# Patient Record
Sex: Male | Born: 1990 | Race: Black or African American | Hispanic: No | Marital: Single | Smoking: Never smoker
Health system: Southern US, Community
[De-identification: ages and names within clinical notes are randomized; demographics above are authoritative.]

## PROBLEM LIST (undated history)

## (undated) DIAGNOSIS — I509 Heart failure, unspecified: Secondary | ICD-10-CM

---

## 2019-09-28 ENCOUNTER — Encounter (HOSPITAL_COMMUNITY): Payer: Self-pay | Admitting: Internal Medicine

## 2019-09-28 ENCOUNTER — Inpatient Hospital Stay (HOSPITAL_COMMUNITY)
Admission: AD | Admit: 2019-09-28 | Discharge: 2019-10-03 | DRG: 293 | Disposition: A | Discharge status: Home / Self Care | Payer: 59 | Source: Ambulatory Visit | Attending: Internal Medicine | Admitting: Internal Medicine

## 2019-09-28 ENCOUNTER — Ambulatory Visit (HOSPITAL_COMMUNITY)
Admission: RE | Admit: 2019-09-28 | Discharge: 2019-09-28 | Disposition: A | Discharge status: Home / Self Care | Payer: Self-pay | Source: Ambulatory Visit | Attending: Internal Medicine | Admitting: Internal Medicine

## 2019-09-28 ENCOUNTER — Other Ambulatory Visit: Payer: Self-pay

## 2019-09-28 VITALS — BP 126/76 | HR 114 | Wt 283.0 lb

## 2019-09-28 DIAGNOSIS — Z87891 Personal history of nicotine dependence: Secondary | ICD-10-CM

## 2019-09-28 DIAGNOSIS — I110 Hypertensive heart disease with heart failure: Secondary | ICD-10-CM | POA: Insufficient documentation

## 2019-09-28 DIAGNOSIS — I5023 Acute on chronic systolic (congestive) heart failure: Secondary | ICD-10-CM | POA: Insufficient documentation

## 2019-09-28 DIAGNOSIS — I5082 Biventricular heart failure: Secondary | ICD-10-CM | POA: Insufficient documentation

## 2019-09-28 DIAGNOSIS — R140 Abdominal distension (gaseous): Secondary | ICD-10-CM | POA: Insufficient documentation

## 2019-09-28 DIAGNOSIS — Z8249 Family history of ischemic heart disease and other diseases of the circulatory system: Secondary | ICD-10-CM

## 2019-09-28 DIAGNOSIS — Z79899 Other long term (current) drug therapy: Secondary | ICD-10-CM | POA: Insufficient documentation

## 2019-09-28 DIAGNOSIS — I428 Other cardiomyopathies: Secondary | ICD-10-CM | POA: Diagnosis present

## 2019-09-28 DIAGNOSIS — E669 Obesity, unspecified: Secondary | ICD-10-CM | POA: Diagnosis present

## 2019-09-28 DIAGNOSIS — Z9114 Patient's other noncompliance with medication regimen: Secondary | ICD-10-CM | POA: Diagnosis not present

## 2019-09-28 LAB — CBC
HCT: 44.7 % (ref 39.0–52.0)
HCT: 45.4 % (ref 39.0–52.0)
Hemoglobin: 14.5 g/dL (ref 13.0–17.0)
Hemoglobin: 15 g/dL (ref 13.0–17.0)
MCH: 27.4 pg (ref 26.0–34.0)
MCH: 27.5 pg (ref 26.0–34.0)
MCHC: 32.4 g/dL (ref 30.0–36.0)
MCHC: 33 g/dL (ref 30.0–36.0)
MCV: 84.3 fL (ref 80.0–100.0)
MCV: 83.3 fL (ref 80.0–100.0)
Platelets: 258 10*3/uL (ref 150–400)
Platelets: 286 10*3/uL (ref 150–400)
RBC: 5.3 MIL/uL (ref 4.22–5.81)
RBC: 5.45 MIL/uL (ref 4.22–5.81)
RDW: 17 % — ABNORMAL HIGH (ref 11.5–15.5)
RDW: 17 % — ABNORMAL HIGH (ref 11.5–15.5)
WBC: 8.9 10*3/uL (ref 4.0–10.5)
WBC: 9.3 10*3/uL (ref 4.0–10.5)
nRBC: 0 % (ref 0.0–0.2)
nRBC: 0 % (ref 0.0–0.2)

## 2019-09-28 LAB — CREATININE, SERUM
Creatinine, Ser: 1.11 mg/dL (ref 0.61–1.24)
GFR calc Af Amer: >60 mL/min (ref >60)
GFR calc non Af Amer: >60 mL/min (ref >60)

## 2019-09-28 LAB — COMPREHENSIVE METABOLIC PANEL
ALT: 34 U/L (ref 0–44)
AST: 39 U/L (ref 15–41)
Albumin: 3.3 g/dL — ABNORMAL LOW (ref 3.5–5.0)
Alkaline Phosphatase: 73 U/L (ref 38–126)
Anion gap: 12 (ref 5–15)
BUN: 8 mg/dL (ref 6–20)
CO2: 21 mmol/L — ABNORMAL LOW (ref 22–32)
Calcium: 8.8 mg/dL — ABNORMAL LOW (ref 8.9–10.3)
Chloride: 99 mmol/L (ref 98–111)
Creatinine, Ser: 1.08 mg/dL (ref 0.61–1.24)
GFR calc Af Amer: >60 mL/min (ref >60)
GFR calc non Af Amer: >60 mL/min (ref >60)
Glucose, Bld: 118 mg/dL — ABNORMAL HIGH (ref 70–99)
Potassium: 3.7 mmol/L (ref 3.5–5.1)
Sodium: 132 mmol/L — ABNORMAL LOW (ref 135–145)
Total Bilirubin: 1.8 mg/dL — ABNORMAL HIGH (ref 0.3–1.2)
Total Protein: 6.2 g/dL — ABNORMAL LOW (ref 6.5–8.1)

## 2019-09-28 LAB — LACTIC ACID, PLASMA
Lactic Acid, Venous: 1.6 mmol/L (ref 0.5–1.9)
Lactic Acid, Venous: 1.2 mmol/L (ref 0.5–1.9)

## 2019-09-28 LAB — ABO/RH: ABO/RH(D): AB POS

## 2019-09-28 LAB — TYPE AND SCREEN
ABO/RH(D): AB POS
Antibody Screen: NEGATIVE

## 2019-09-28 LAB — MRSA PCR SCREENING: MRSA by PCR: NEGATIVE

## 2019-09-28 LAB — PROTIME-INR
INR: 1.4 — ABNORMAL HIGH (ref 0.8–1.2)
Prothrombin Time: 17.3 seconds — ABNORMAL HIGH (ref 11.4–15.2)

## 2019-09-28 LAB — HIV ANTIBODY (ROUTINE TESTING W REFLEX): HIV Screen 4th Generation wRfx: NONREACTIVE

## 2019-09-28 LAB — BRAIN NATRIURETIC PEPTIDE: B Natriuretic Peptide: 3046.4 pg/mL — ABNORMAL HIGH (ref 0.0–100.0)

## 2019-09-28 LAB — TSH: TSH: 3.28 u[IU]/mL (ref 0.350–4.500)

## 2019-09-28 MED ORDER — FUROSEMIDE 10 MG/ML IJ SOLN
80.0000 mg | Freq: Two times a day (BID) | INTRAMUSCULAR | Status: DC
  Administered 2019-09-28 – 2019-10-01 (×7): 80 mg via INTRAVENOUS
  Filled 2019-09-28 (×7): qty 8

## 2019-09-28 MED ORDER — DIGOXIN 125 MCG PO TABS
0.1250 mg | ORAL_TABLET | Freq: Every day | ORAL | Status: DC
  Administered 2019-09-28 – 2019-10-03 (×6): 0.125 mg via ORAL
  Filled 2019-09-28 (×6): qty 1

## 2019-09-28 MED ORDER — SACUBITRIL-VALSARTAN 24-26 MG PO TABS
1.0000 | ORAL_TABLET | Freq: Two times a day (BID) | ORAL | Status: DC
  Administered 2019-09-28 – 2019-10-01 (×7): 1 via ORAL
  Filled 2019-09-28 (×7): qty 1

## 2019-09-28 MED ORDER — HEPARIN SODIUM (PORCINE) 5000 UNIT/ML IJ SOLN
5000.0000 [IU] | Freq: Three times a day (TID) | INTRAMUSCULAR | Status: DC
  Administered 2019-09-28 – 2019-10-03 (×14): 5000 [IU] via SUBCUTANEOUS
  Filled 2019-09-28 (×14): qty 1

## 2019-09-28 MED ORDER — ACETAMINOPHEN 325 MG PO TABS: 650.0000 mg | ORAL_TABLET | ORAL | Status: DC | PRN

## 2019-09-28 MED ORDER — SPIRONOLACTONE 12.5 MG HALF TABLET
12.5000 mg | ORAL_TABLET | Freq: Every day | ORAL | Status: DC
  Administered 2019-09-28 – 2019-10-01 (×4): 12.5 mg via ORAL
  Filled 2019-09-28 (×4): qty 1

## 2019-09-28 MED ORDER — SODIUM CHLORIDE 0.9% FLUSH: 3.0000 mL | INTRAVENOUS | Status: DC | PRN

## 2019-09-28 MED ORDER — SODIUM CHLORIDE 0.9 % IV SOLN: 250.0000 mL | INTRAVENOUS | Status: DC | PRN

## 2019-09-28 NOTE — Plan of Care (Signed)

## 2019-09-28 NOTE — H&P (Signed)
ADVANCED HF TEAM H&P NOTE  Referring Provider: Ulyess Blossom PA-C   HPI:  Brandon Huber is a 28 y.o. male who is referred by Melven Sartorius PA for further management of systolic HF due to NICM.   He has a prior history of obesity, hypertension (dx years ago but has not been treated) and tobacco abuse. He presented [facility] on 07/22/19. BNP 2800. Echocardiogram obtained showed an LVEF of less than 25%, severely dilated LV 1-2+ MR. Left heart catheterization revealed no significant CAD. RHC showed a mean wedge of 20, Fick CO/CI 4.02/1.7. He was diuresed approximately 20 pounds. He underwent cardiac MRI that showed no signs of infiltration but severely dilated LV and RV. He was placed on carvedilol, Entresto and Lasix at discharge. He was also given a LifeVest.  He is here with his mother today. Was seen at the HF Clinic at [facility] about a month ago and felt well. At that point had not filled Entresto but is now on it. . Ran out of meds 2 weeks ago and restarted several days later.  Since that time notes increasing LE edema and abdominal bloating Can do all ADLs without problem. Gets SOB walking to the mailbox on bad days particularly with extra fluid on board now. Not weighing himself regularly. Tries to watch his fluid intake. Not sure if he snores. Says he has problems sleeping. + PND.   Occasional ETOH. No drugs    He reports that he has stopped smoking. He says his father died of an "enlarged heart" that he thought was due to drug use and HTN. He says his maternal side has heart failure but at older agesays his mom does not have heart failure.  MGM -> cabg in 40s died in 18s   Review of Systems: [y] = yes, [ ]  = no    General: Weight gain [ y]; Weight loss [ ] ; Anorexia [ ] ; Fatigue [ y]; Fever [ ] ; Chills [ ] ; Weakness [ ]    Cardiac: Chest pain/pressure [ ] ; Resting SOB [ y]; Exertional SOB ]; Orthopnea [ y]; Pedal Edema [ y]; Palpitations [ ] ; Syncope [ ] ;  Presyncope [ ] ; Paroxysmal nocturnal dyspnea[ ]    Pulmonary: Cough [ y]; Wheezing[ ] ; Hemoptysis[ ] ; Sputum [ ] ; Snoring [ ]    GI: Vomiting[ ] ; Dysphagia[ ] ; Melena[ ] ; Hematochezia [ ] ; Heartburn[ ] ; Abdominal pain [ ] ; Constipation [ ] ; Diarrhea [ ] ; BRBPR [ ]    GU: Hematuria[ ] ; Dysuria [ ] ; Nocturia[ ]    Vascular: Pain in legs with walking [ ] ; Pain in feet with lying flat [ ] ; Non-healing sores [ ] ; Stroke [ ] ; TIA [ ] ; Slurred speech [ ] ;   Neuro: Headaches[ ] ; Vertigo[ ] ; Seizures[ ] ; Paresthesias[ ] ;Blurred vision [ ] ; Diplopia [ ] ; Vision changes [ ]    Ortho/Skin: Arthritis [ ] ; Joint pain [ ] ; Muscle pain [ ] ; Joint swelling [ ] ; Back Pain [ ] ; Rash [ ]    Psych: Depression[ ] ; Anxiety[ ]    Heme: Bleeding problems [ ] ; Clotting disorders [ ] ; Anemia [ ]    Endocrine: Diabetes [ ] ; Thyroid dysfunction[ ]    PMHx: 1. Systolic HF  - due to NICM 2. HTN 3. Tobacco use         Current Outpatient Medications  Medication Sig Dispense Refill  . carvedilol (COREG) 6.25 MG tablet TAKE ONE TABLET (6.25 MG DOSE) BY MOUTH 2 (TWO) TIMES DAILY.    . furosemide (LASIX) 40 MG  tablet Take 40 mg by mouth 2 (two) times daily.     . potassium chloride (KLOR-CON) 10 MEQ tablet Take 10 mEq by mouth daily.     . sacubitril-valsartan (ENTRESTO) 24-26 MG Take 1 tablet by mouth 2 (two) times daily.      No current facility-administered medications for this encounter.      Social History        Socioeconomic History  . Marital status: Unknown    Spouse name: Not on file  . Number of children: Not on file  . Years of education: Not on file  . Highest education level: Not on file  Occupational History  . Not on file  Social Needs  . Financial resource strain: Not on file  . Food insecurity    Worry: Not on file    Inability: Not on file  . Transportation needs    Medical: Not on file    Non-medical: Not on file  Tobacco Use  . Smoking status: Never Smoker  .  Smokeless tobacco: Never Used  Substance and Sexual Activity  . Alcohol use: Never    Frequency: Never  . Drug use: Not on file  . Sexual activity: Not on file  Lifestyle  . Physical activity    Days per week: Not on file    Minutes per session: Not on file  . Stress: Not on file  Relationships  . Social Musician on phone: Not on file    Gets together: Not on file    Attends religious service: Not on file    Active member of club or organization: Not on file    Attends meetings of clubs or organizations: Not on file    Relationship status: Not on file  . Intimate partner violence    Fear of current or ex partner: Not on file    Emotionally abused: Not on file    Physically abused: Not on file    Forced sexual activity: Not on file  Other Topics Concern  . Not on file  Social History Narrative  . Not on file    Family Hx: His father died of an "enlarged heart" that he thought was due to drug use. He says his maternal side has a lot of heart failure but says his mom does not have heart failure. Mom has HTN      Vitals:   09/28/19 1511  BP: 126/76  Pulse: (!) 114  SpO2: 100%  Weight: 128.4 kg (283 lb)    PHYSICAL EXAM: General:  Ill appearing. No respiratory difficulty HEENT: normal Neck: supple. JVP to jaw Carotids 2+ bilat; no bruits. No lymphadenopathy or thryomegaly appreciated. Cor: PMI laterally displaced. Tachy regular +s  Lungs: clear Abdomen: Obese soft, nontender, + distended. No hepatosplenomegaly. No bruits or masses. Good bowel sounds. Extremities: no cyanosis, clubbing, rash, 3+ edema cool Neuro: alert & oriented x 3, cranial nerves grossly intact. moves all 4 extremities w/o difficulty. Affect pleasant.  ECG: Sinus tach 114 iRBBB ( ) Personally reviewed    ASSESSMENT & PLAN: 1. Acute on chronic systolic HF with biventricular failure and low output - recent onset 7/20. Due to NICM but exact  etiology unclear ? OSA - Cath 7/20 at [facility]. With normal cors. CI 1.7 - Echo 7/20 EF 20% with biventricular failure - Now markedly volume overloaded in setting of recent medication non compliance - NYHA IIIB-IV - Will need to be admitted for ADH - Lasix 80 IV bid -  Continue Entresto 24/26 bid - Start spiro 12.5  - Hold carvedilol  - Start digoxin 0.125 - Repeat echo - Discussed severity of situation and possible need for advanced therapies. If not improving quickly with diuresis will need PICC to follow CVP and co-ox - Recent tobacco use but none for 2 months - Consider cMRI if size permits - Needs sleep study  2. Obesity - needs weight loss  3. Tobacco use. - currently quit  Glori Bickers, MD  4:09 PM

## 2019-09-28 NOTE — Addendum Note (Signed)
Encounter addended by: Scarlette Calico, RN on: 09/28/2019 4:29 PM  Actions taken: Clinical Note Signed

## 2019-09-28 NOTE — Progress Notes (Signed)
ADVANCED HF CLINIC CONSULT NOTE  Referring Provider: Ulyess Blossom PA-C   HPI:  Brandon Huber is a 28 y.o. male who is referred by Melven Sartorius PA for further management of systolic HF due to NICM.   He has a prior history of obesity, hypertension (dx years ago but has not been treated) and tobacco abuse. He presented [facility] on 07/22/19. BNP 2800. Echocardiogram obtained showed an LVEF of less than 25%, severely dilated LV 1-2+ MR. Left heart catheterization revealed no significant CAD. RHC showed a mean wedge of 20, Fick CO/CI 4.02/1.7. He was diuresed approximately 20 pounds. He underwent cardiac MRI that showed no signs of infiltration but severely dilated LV and RV. He was placed on carvedilol, Entresto and Lasix at discharge. He was also given a LifeVest.  He is here with his mother today. Was seen at the HF Clinic at [facility] about a month ago and felt well. At that point had not filled Entresto but is now on it. . Ran out of meds 2 weeks ago and restarted several days later.  Since that time notes increasing LE edema and abdominal bloating Can do all ADLs without problem. Gets SOB walking to the mailbox on bad days particularly with extra fluid on board now. Not weighing himself regularly. Tries to watch his fluid intake. Not sure if he snores. Says he has problems sleeping. + PND.   Occasional ETOH. No drugs    He reports that he has stopped smoking. He says his father died of an "enlarged heart" that he thought was due to drug use and HTN. He says his maternal side has heart failure but at older agesays his mom does not have heart failure.   MGM -> cabg in 40s died in 65s   Review of Systems: [y] = yes, [ ]  = no   General: Weight gain [ y]; Weight loss [ ] ; Anorexia [ ] ; Fatigue [ y]; Fever [ ] ; Chills [ ] ; Weakness [ ]   Cardiac: Chest pain/pressure [ ] ; Resting SOB [ y]; Exertional SOB ]; Orthopnea [ y]; Pedal Edema [ y]; Palpitations [ ] ; Syncope [ ] ; Presyncope  [ ] ; Paroxysmal nocturnal dyspnea[ ]   Pulmonary: Cough [ y]; Wheezing[ ] ; Hemoptysis[ ] ; Sputum [ ] ; Snoring [ ]   GI: Vomiting[ ] ; Dysphagia[ ] ; Melena[ ] ; Hematochezia [ ] ; Heartburn[ ] ; Abdominal pain [ ] ; Constipation [ ] ; Diarrhea [ ] ; BRBPR [ ]   GU: Hematuria[ ] ; Dysuria [ ] ; Nocturia[ ]   Vascular: Pain in legs with walking [ ] ; Pain in feet with lying flat [ ] ; Non-healing sores [ ] ; Stroke [ ] ; TIA [ ] ; Slurred speech [ ] ;  Neuro: Headaches[ ] ; Vertigo[ ] ; Seizures[ ] ; Paresthesias[ ] ;Blurred vision [ ] ; Diplopia [ ] ; Vision changes [ ]   Ortho/Skin: Arthritis [ ] ; Joint pain [ ] ; Muscle pain [ ] ; Joint swelling [ ] ; Back Pain [ ] ; Rash [ ]   Psych: Depression[ ] ; Anxiety[ ]   Heme: Bleeding problems [ ] ; Clotting disorders [ ] ; Anemia [ ]   Endocrine: Diabetes [ ] ; Thyroid dysfunction[ ]    PMHx: 1. Systolic HF  - due to NICM 2. HTN 3. Tobacco use   Current Outpatient Medications  Medication Sig Dispense Refill  . carvedilol (COREG) 6.25 MG tablet TAKE ONE TABLET (6.25 MG DOSE) BY MOUTH 2 (TWO) TIMES DAILY.    . furosemide (LASIX) 40 MG tablet Take 40 mg by mouth 2 (two) times daily.     . potassium chloride (  KLOR-CON) 10 MEQ tablet Take 10 mEq by mouth daily.     . sacubitril-valsartan (ENTRESTO) 24-26 MG Take 1 tablet by mouth 2 (two) times daily.      No current facility-administered medications for this encounter.      Social History   Socioeconomic History  . Marital status: Unknown    Spouse name: Not on file  . Number of children: Not on file  . Years of education: Not on file  . Highest education level: Not on file  Occupational History  . Not on file  Social Needs  . Financial resource strain: Not on file  . Food insecurity    Worry: Not on file    Inability: Not on file  . Transportation needs    Medical: Not on file    Non-medical: Not on file  Tobacco Use  . Smoking status: Never Smoker  . Smokeless tobacco: Never Used  Substance and Sexual Activity  .  Alcohol use: Never    Frequency: Never  . Drug use: Not on file  . Sexual activity: Not on file  Lifestyle  . Physical activity    Days per week: Not on file    Minutes per session: Not on file  . Stress: Not on file  Relationships  . Social Musician on phone: Not on file    Gets together: Not on file    Attends religious service: Not on file    Active member of club or organization: Not on file    Attends meetings of clubs or organizations: Not on file    Relationship status: Not on file  . Intimate partner violence    Fear of current or ex partner: Not on file    Emotionally abused: Not on file    Physically abused: Not on file    Forced sexual activity: Not on file  Other Topics Concern  . Not on file  Social History Narrative  . Not on file    Family Hx: His father died of an "enlarged heart" that he thought was due to drug use. He says his maternal side has a lot of heart failure but says his mom does not have heart failure. Mom has HTN   Vitals:   09/28/19 1511  BP: 126/76  Pulse: (!) 114  SpO2: 100%  Weight: 128.4 kg (283 lb)    PHYSICAL EXAM: General:  Ill appearing. No respiratory difficulty HEENT: normal Neck: supple. JVP to jaw Carotids 2+ bilat; no bruits. No lymphadenopathy or thryomegaly appreciated. Cor: PMI laterally displaced. Tachy regular +s  Lungs: clear Abdomen: Obese soft, nontender, + distended. No hepatosplenomegaly. No bruits or masses. Good bowel sounds. Extremities: no cyanosis, clubbing, rash, 3+ edema cool Neuro: alert & oriented x 3, cranial nerves grossly intact. moves all 4 extremities w/o difficulty. Affect pleasant.  ECG: Sinus tach 114 iRBBB ( ) Personally reviewed    ASSESSMENT & PLAN: 1. Acute on chronic systolic HF with biventricular failure and low output - recent onset 7/20. Due to NICM but exact etiology unclear ? OSA [facility] With normal cors. CI 1.7 - Echo 7/20 EF 20% with biventricular  failure - Now markedly volume overloaded in setting of recent medication non compliance - NYHA IIIB-IV - Will need to be admitted for ADH - Lasix 80 IV bid - Continue Entresto 24/26 bid - Start spiro 12.5  - Hold carvedilol  - Start digoxin 0.125 - Repeat echo - Discussed severity of  situation and possible need for advanced therapies. If not improving quickly with diuresis will need PICC to follow CVP and co-ox - Recent tobacco use but none for 2 months - Consider cMRI if size permits - Needs sleep study  2. Obesity - needs weight loss  3. Tobacco use. - currently quit  Glori Bickers, MD  4:09 PM

## 2019-09-28 NOTE — Addendum Note (Signed)
Encounter addended by: Scarlette Calico, RN on: 09/28/2019 4:25 PM  Actions taken: Diagnosis association updated, Order list changed

## 2019-09-28 NOTE — Progress Notes (Signed)
Report called RN on 2C, will transport pt.

## 2019-09-29 ENCOUNTER — Inpatient Hospital Stay (HOSPITAL_COMMUNITY): Payer: 59

## 2019-09-29 ENCOUNTER — Encounter (HOSPITAL_COMMUNITY): Payer: Self-pay | Admitting: *Deleted

## 2019-09-29 DIAGNOSIS — I371 Nonrheumatic pulmonary valve insufficiency: Secondary | ICD-10-CM

## 2019-09-29 DIAGNOSIS — I5023 Acute on chronic systolic (congestive) heart failure: Secondary | ICD-10-CM

## 2019-09-29 DIAGNOSIS — I361 Nonrheumatic tricuspid (valve) insufficiency: Secondary | ICD-10-CM

## 2019-09-29 LAB — BASIC METABOLIC PANEL
Anion gap: 12 (ref 5–15)
BUN: 10 mg/dL (ref 6–20)
CO2: 26 mmol/L (ref 22–32)
Calcium: 8.6 mg/dL — ABNORMAL LOW (ref 8.9–10.3)
Chloride: 98 mmol/L (ref 98–111)
Creatinine, Ser: 1.15 mg/dL (ref 0.61–1.24)
GFR calc Af Amer: >60 mL/min (ref >60)
GFR calc non Af Amer: >60 mL/min (ref >60)
Glucose, Bld: 88 mg/dL (ref 70–99)
Potassium: 3.6 mmol/L (ref 3.5–5.1)
Sodium: 136 mmol/L (ref 135–145)

## 2019-09-29 LAB — ECHOCARDIOGRAM COMPLETE: Weight: 4317.49 oz

## 2019-09-29 MED ORDER — INFLUENZA VAC SPLIT QUAD 0.5 ML IM SUSY: 0.5000 mL | PREFILLED_SYRINGE | INTRAMUSCULAR | Status: DC

## 2019-09-29 MED ORDER — POTASSIUM CHLORIDE CRYS ER 20 MEQ PO TBCR
40.0000 meq | EXTENDED_RELEASE_TABLET | Freq: Once | ORAL | Status: AC
  Administered 2019-09-29: 08:00:00 40 meq via ORAL
  Filled 2019-09-29: qty 2

## 2019-09-29 MED ORDER — PNEUMOCOCCAL VAC POLYVALENT 25 MCG/0.5ML IJ INJ: 0.5000 mL | INJECTION | INTRAMUSCULAR | Status: DC

## 2019-09-29 NOTE — Plan of Care (Signed)

## 2019-09-29 NOTE — Progress Notes (Addendum)
Advanced Heart Failure Rounding Note  PCP-Cardiologist: Dr. Haroldine Laws   Subjective:    Feeling a bit better this morning. No resting dyspnea. He was able to sleep fairly flat last night w/o orthopnea or PND. Reports good UOP w/ IV lasix and filled 3-4 urinals overnight. Weight down 12 pounds   Objective:   Weight Range: 122.4 kg There is no height or weight on file to calculate BMI.   Vital Signs:   Temp:  [97.7 F (36.5 C)-98.9 F (37.2 C)] 97.8 F (36.6 C) (10/01 0403) Pulse Rate:  [100-107] 100 (09/30 1951) Resp:  [16-20] 18 (10/01 0403) BP: (99-130)/(73-94) 130/87 (10/01 0403) SpO2:  [96 %-99 %] 98 % (10/01 0403) FiO2 (%):  [21 %] 21 % (09/30 1626) Weight:  [122.4 kg-127.7 kg] 122.4 kg (10/01 0500) Last BM Date: 09/28/19  Weight change: Filed Weights   09/28/19 1626 09/29/19 0500  Weight: 127.7 kg 122.4 kg    Intake/Output:   Intake/Output Summary (Last 24 hours) at 09/29/2019 0739 Last data filed at 09/28/2019 2000 Gross per 24 hour  Intake -  Output 1325 ml  Net -1325 ml    Physical Exam    General:  Brandon Huber, fatigued appearing. No resp difficulty HEENT: Normal anicteric  Neck: Supple. Elevated JVP just distal of the ear . Carotids 2+ bilat; no bruits. No lymphadenopathy or thyromegaly appreciated. Cor: PMI nondisplaced. Regular rate & rhythm. +s3 soft MR Lungs: Clear Abdomen: Obese Soft, nontender, nondistended. No hepatosplenomegaly. No bruits or masses. Good bowel sounds. Extremities: No cyanosis, clubbing, rash, 1+ edema Neuro: Alert & orientedx3, cranial nerves grossly intact. moves all 4 extremities w/o difficulty. Affect pleasant   Telemetry   NSR 90-100s, few PVCs no adverse arrhythmias  Personally reviewed   EKG    09/28/19 - sinus tach 114 bpm w/ incomplete RBBB No new EKG to interpret today  Labs    CBC Recent Labs    09/28/19 1616 09/28/19 1701  WBC 8.9 9.3  HGB 14.5 15.0  HCT 44.7 45.4  MCV 84.3 83.3  PLT 258 081    Basic Metabolic Panel Recent Labs    09/28/19 1616 09/28/19 1701 09/29/19 0207  NA 132*  --  136  K 3.7  --  3.6  CL 99  --  98  CO2 21*  --  26  GLUCOSE 118*  --  88  BUN 8  --  10  CREATININE 1.08 1.11 1.15  CALCIUM 8.8*  --  8.6*   Liver Function Tests Recent Labs    09/28/19 1616  AST 39  ALT 34  ALKPHOS 73  BILITOT 1.8*  PROT 6.2*  ALBUMIN 3.3*   No results for input(s): LIPASE, AMYLASE in the last 72 hours. Cardiac Enzymes No results for input(s): CKTOTAL, CKMB, CKMBINDEX, TROPONINI in the last 72 hours.  BNP: BNP (last 3 results) Recent Labs    09/28/19 1616  BNP 3,046.4*    ProBNP (last 3 results) No results for input(s): PROBNP in the last 8760 hours.   D-Dimer No results for input(s): DDIMER in the last 72 hours. Hemoglobin A1C No results for input(s): HGBA1C in the last 72 hours. Fasting Lipid Panel No results for input(s): CHOL, HDL, LDLCALC, TRIG, CHOLHDL, LDLDIRECT in the last 72 hours. Thyroid Function Tests Recent Labs    09/28/19 1616  TSH 3.280    Other results: Repeat 2D echo pending   Imaging    No results found.   Medications:  Scheduled Medications: . digoxin  0.125 mg Oral Daily  . furosemide  80 mg Intravenous BID  . heparin  5,000 Units Subcutaneous Q8H  . [START ON 10/01/2019] influenza vac split quadrivalent PF  0.5 mL Intramuscular Tomorrow-1000  . [START ON 10/01/2019] pneumococcal 23 valent vaccine  0.5 mL Intramuscular Tomorrow-1000  . sacubitril-valsartan  1 tablet Oral BID  . spironolactone  12.5 mg Oral Daily    Infusions: . sodium chloride      PRN Medications: sodium chloride, acetaminophen, sodium chloride flush    Patient Profile   28 y/o obese male w/ h/o HTN, tobacco abuse, family h/o CHF (father) and recent diagnosis of systolic HF due to NICM, admitted for acute on chronic systolic HF, in the setting of poor medication compliance.   Assessment/Plan    1. NICM: new diagnosis of DCM  06/2019 at [facility]. Echo w/ severely reduced LVEF at 20% w/ biventricular failure. LHC 06/2019 showed normal coronaries. RHC showed mean wedge of 20, FICK CO/CI 4.02/1.7. ANA negative, ESR low at 5, Lyme disease antibody <0.8. cMRI showed no signs of infiltration but severely dilated LV and RV. TSH here at [facility] WNL. ? Etiology. ?OSA, hypertensive heart disease vs familial (father w/ CHF) - Treat w/ guidelines directed medical therapy - Entresto 24-26 - Spironolactone 12.5 - Digoxin 0.125 - No  blocker currently due to suspected low output  - Will gradually titrate HF regimen to maximum tolerated doses - Will ultimately need repeat 2D Echo after 3 months of maximum tolerated medical therapy - If EF remains < 35%, plan to refer to EP for ICD for primary prevention    2. Acute on Chronic Systolic HF: acute decompensation in the setting of poor compliance w/ outpatient HF regimen. Presented to [facility] 9/30 w/ exertional dyspnea, PND, LEE and abdominal bloating. BNP 3,046. TSH normal. Admit wt 281 lb. Started on IV Lasix 80 mg bid (home regimen= 40 PO lasix bid) - pt reports good UOP overnight but only 1.3L documented. May not be accurate - wt down from 281 on admit to 269 lb today (Pt reports dry weight = 250-255 lb) - still w/ evidence of volume overload on exam w/ bilateral LEE and elevated JVD - Scr and K WNL - Continue IV Lasix 80 mg bid - Continue spironolactone  - May benefit from addition of metolazone  - Strict I/Os + daily weights - Daily BMPs for close monitoring of renal function and electrolytes - Low salt diet   3. HTN: controlled on current regimen -Continue Entresto and spironolactone -Monitor pressure while diuresing w/ IV lasix  4. Obesity:  - weigh loss advised - needs outpatient sleep study to r/o OSA  5. Tobacco Abuse:  - recently quit 2 months ago  Length of Stay: 1  Brandon Jester, PA-C  09/29/2019, 7:39 AM  Advanced Heart Failure Team Pager  [phone] (M-F; 7a - 4p)  Please contact [facility] for night-coverage after hours (4p -7a ) and weekends on [email or web address]  Patient seen and examined with the above-signed Advanced Practice Provider and/or Housestaff. I personally reviewed laboratory data, imaging studies and relevant notes. I independently examined the patient and formulated the important aspects of the plan. I have edited the note to reflect any of my changes or salient points. I have personally discussed the plan with the patient and/or family.  Diuresed well overnight on IV lasix. Breathing better but still remains tenuous with sinus tach and s3. Still with volume overload. Creatinine and lactic  acid ok.  Will continue IV lasix. Continue dig, entresto and spiro. Supp K. Check mag in am. Echo today. Blood type AB+   Glori Bickers, MD  8:07 AM

## 2019-09-29 NOTE — Progress Notes (Signed)
  Echocardiogram 2D Echocardiogram has been performed.  Nakul Avino G Elazar Argabright 09/29/2019, 10:21 AM

## 2019-09-30 LAB — BASIC METABOLIC PANEL
Anion gap: 12 (ref 5–15)
BUN: 9 mg/dL (ref 6–20)
CO2: 30 mmol/L (ref 22–32)
Calcium: 9 mg/dL (ref 8.9–10.3)
Chloride: 97 mmol/L — ABNORMAL LOW (ref 98–111)
Creatinine, Ser: 1.09 mg/dL (ref 0.61–1.24)
GFR calc Af Amer: >60 mL/min (ref >60)
GFR calc non Af Amer: >60 mL/min (ref >60)
Glucose, Bld: 127 mg/dL — ABNORMAL HIGH (ref 70–99)
Potassium: 3.6 mmol/L (ref 3.5–5.1)
Sodium: 139 mmol/L (ref 135–145)

## 2019-09-30 LAB — MAGNESIUM: Magnesium: 1.7 mg/dL (ref 1.7–2.4)

## 2019-09-30 MED ORDER — MAGNESIUM SULFATE 2 GM/50ML IV SOLN
2.0000 g | Freq: Once | INTRAVENOUS | Status: AC
  Administered 2019-09-30: 2 g via INTRAVENOUS
  Filled 2019-09-30: qty 50

## 2019-09-30 MED ORDER — POTASSIUM CHLORIDE CRYS ER 20 MEQ PO TBCR
40.0000 meq | EXTENDED_RELEASE_TABLET | Freq: Two times a day (BID) | ORAL | Status: AC
  Administered 2019-09-30 (×2): 40 meq via ORAL
  Filled 2019-09-30 (×2): qty 2

## 2019-09-30 NOTE — Progress Notes (Addendum)
Advanced Heart Failure Rounding Note  PCP-Cardiologist: Dr. Haroldine Laws   Subjective:    Excellent diuresis with IV Lasix w/ additional 8L out yesterday. Net I/Os -12L since admit. Wt down from admit wt of 281 lb to 256 lb. Previous dry weight ~250 lb.   Feels better. Denies resting dyspnea. No orthopnea or PND. No dyspnea ambulating around room but has not ambulated longer distances.   Objective:   Weight Range: 116.3 kg There is no height or weight on file to calculate BMI.   Vital Signs:   Temp:  [97.7 F (36.5 C)-98.5 F (36.9 C)] 97.7 F (36.5 C) (10/02 0746) Pulse Rate:  [98-104] 104 (10/02 0949) Resp:  [15-20] 16 (10/02 0746) BP: (106-119)/(69-82) 111/74 (10/02 0746) SpO2:  [98 %-100 %] 98 % (10/02 0746) Weight:  [116.3 kg] 116.3 kg (10/02 0214) Last BM Date: 09/29/19(Per the Pt)  Weight change: Filed Weights   09/28/19 1626 09/29/19 0500 09/30/19 0214  Weight: 127.7 kg 122.4 kg 116.3 kg    Intake/Output:   Intake/Output Summary (Last 24 hours) at 09/30/2019 1110 Last data filed at 09/30/2019 0954 Gross per 24 hour  Intake 360 ml  Output 7040 ml  Net -6680 ml    Physical Exam    General:  Young AAM in no acute distress. Normal work of breathing HEENT: Normal anicteric  Neck: Supple. mildly elevated JVD. Carotids 2+ bilat; no bruits. No lymphadenopathy or thyromegaly appreciated. Cor: PMI nondisplaced. Regular rhythm. Tachy rate, soft MR murmur  Lungs: CTAB  No wheeze Abdomen: Obese Soft, nontender, nondistended. No hepatosplenomegaly. No bruits or masses. Good bowel sounds. Extremities: No cyanosis, clubbing, rash, 1+ pedal edema. Trace pretibial edema  Neuro: alert & oriented x 3, cranial nerves grossly intact. moves all 4 extremities w/o difficulty. Affect pleasant    Telemetry   Sinus tach 110s  Personally reviewed  EKG    No new EKG to interpret today  Labs    CBC Recent Labs    09/28/19 1616 09/28/19 1701  WBC 8.9 9.3  HGB 14.5 15.0   HCT 44.7 45.4  MCV 84.3 83.3  PLT 258 583   Basic Metabolic Panel Recent Labs    09/29/19 0207 09/30/19 0148  NA 136 139  K 3.6 3.6  CL 98 97*  CO2 26 30  GLUCOSE 88 127*  BUN 10 9  CREATININE 1.15 1.09  CALCIUM 8.6* 9.0  MG  --  1.7   Liver Function Tests Recent Labs    09/28/19 1616  AST 39  ALT 34  ALKPHOS 73  BILITOT 1.8*  PROT 6.2*  ALBUMIN 3.3*   No results for input(s): LIPASE, AMYLASE in the last 72 hours. Cardiac Enzymes No results for input(s): CKTOTAL, CKMB, CKMBINDEX, TROPONINI in the last 72 hours.  BNP: BNP (last 3 results) Recent Labs    09/28/19 1616  BNP 3,046.4*    ProBNP (last 3 results) No results for input(s): PROBNP in the last 8760 hours.   D-Dimer No results for input(s): DDIMER in the last 72 hours. Hemoglobin A1C No results for input(s): HGBA1C in the last 72 hours. Fasting Lipid Panel No results for input(s): CHOL, HDL, LDLCALC, TRIG, CHOLHDL, LDLDIRECT in the last 72 hours. Thyroid Function Tests Recent Labs    09/28/19 1616  TSH 3.280    Other results: Repeat 2D echo pending  IMPRESSIONS    1. Left ventricular ejection fraction, by visual estimation, is <20%. The left ventricle has severely decreased function. Moderately increased left  ventricular size. Left ventricular septal wall thickness was normal. There is no left ventricular  hypertrophy.  2. Left ventricular diastolic Doppler parameters are consistent with pseudonormalization pattern of LV diastolic filling.  3. Diffuse hypokinesis EF 15%.  4. Global right ventricle has mildly reduced systolic function.The right ventricular size is mildly enlarged. No increase in right ventricular wall thickness.  5. Left atrial size was moderately dilated.  6. Right atrial size was mildly dilated.  7. The mitral valve is normal in structure. Mild mitral valve regurgitation.  8. The tricuspid valve is normal in structure. Tricuspid valve regurgitation moderate.  9. The  aortic valve is tricuspid Aortic valve regurgitation was not visualized by color flow Doppler. Mild aortic valve sclerosis without stenosis. 10. The pulmonic valve was grossly normal. Pulmonic valve regurgitation is mild by color flow Doppler. 11. Normal pulmonary artery systolic pressure.   Imaging    No results found.   Medications:     Scheduled Medications: . digoxin  0.125 mg Oral Daily  . furosemide  80 mg Intravenous BID  . heparin  5,000 Units Subcutaneous Q8H  . [START ON 10/01/2019] influenza vac split quadrivalent PF  0.5 mL Intramuscular Tomorrow-1000  . [START ON 10/01/2019] pneumococcal 23 valent vaccine  0.5 mL Intramuscular Tomorrow-1000  . potassium chloride  40 mEq Oral BID  . sacubitril-valsartan  1 tablet Oral BID  . spironolactone  12.5 mg Oral Daily    Infusions: . sodium chloride    . magnesium sulfate bolus IVPB      PRN Medications: sodium chloride, acetaminophen, sodium chloride flush    Patient Profile   28 y/o obese male w/ h/o HTN, tobacco abuse, family h/o CHF (father) and recent diagnosis of systolic HF due to NICM, admitted for acute on chronic systolic HF, in the setting of poor medication compliance.   Assessment/Plan    1. NICM: new diagnosis of DCM 06/2019 at [facility]. Echo w/ severely reduced LVEF at 20% w/ biventricular failure. LHC 06/2019 showed normal coronaries. RHC showed mean wedge of 20, FICK CO/CI 4.02/1.7. ANA negative, ESR low at 5, Lyme disease antibody <0.8. cMRI showed no signs of infiltration but severely dilated LV and RV. TSH here at [facility] WNL. Repeat echo 10/1 showed EF at 15% no LVH, has mildly reduced RV systolic dysfunction w/ normal PA systolic pressure. Mild MR. ? Etiology. ?OSA, hypertensive heart disease vs familial (father w/ CHF) - Treat w/ guidelines directed medical therapy - Entresto 24-26 - Spironolactone 12.5 - Digoxin 0.125 - No  blocker currently due to suspected low output  - Will  gradually titrate HF regimen to maximum tolerated doses in clinic  - Will ultimately need repeat 2D Echo after 3 months of maximum tolerated medical therapy - If EF remains < 35%, plan to refer to EP for ICD for primary prevention. Consider Lifevest on d/c - Blood type AB+. Would be a good transplant candidate if HF dose not improve w/ medical management.  -Consult placed for CR to ambulate today    2. Acute on Chronic Systolic HF: acute decompensation in the setting of poor compliance w/ outpatient HF regimen. Presented to [facility] 9/30 w/ exertional dyspnea, PND, LEE and abdominal bloating. BNP 3,046. TSH normal. Admit wt 281 lb. Started on IV Lasix 80 mg bid (home regimen= 40 PO lasix bid) - Excellent diuresis with IV Lasix w/ additional 8L out yesterday. Net I/Os -12L since admit. Wt down from admit wt of 281 lb to 256 lb. Previous  dry weight ~250 lb.  -Still w/ mild volume overload and remains 6 lb above dry weight  -SCr and K stable -Continue IV diuretics today. Likely transition to PO lasix in the next 24 hrs - Continue spironolactone  - Continue strict I/Os + daily weights - Daily BMPs for close monitoring of renal function and electrolytes - Low salt diet   3. HTN: controlled on current regimen -Continue Entresto and spironolactone -Monitor pressure while diuresing w/ IV lasix  4. Obesity:  - weigh loss advised - needs outpatient sleep study to r/o OSA  5. Tobacco Abuse:  - recently quit 2 months ago  Length of Stay: 2  Lyda Jester, PA-C  09/30/2019, 11:10 AM  Patient seen and examined with the above-signed Advanced Practice Provider and/or Housestaff. I personally reviewed laboratory data, imaging studies and relevant notes. I independently examined the patient and formulated the important aspects of the plan. I have edited the note to reflect any of my changes or salient points. I have personally discussed the plan with the patient and/or family.  Brisk diuresis  overnight on IV lasix. Weight down 25 pounds but probably another 5-10 pounds to go. Creatinine stable. K 3.6. Will supp. Increase spiro to 25.  Long talk about need for medication compliance.   Glori Bickers, MD  7:11 PM

## 2019-09-30 NOTE — Plan of Care (Signed)
  Problem: Activity: Goal: Capacity to carry out activities will improve Outcome: Progressing   Problem: Education: Goal: Knowledge of General Education information will improve Description: Including pain rating scale, medication(s)/side effects and non-pharmacologic comfort measures Outcome: Progressing   Problem: Clinical Measurements: Goal: Cardiovascular complication will be avoided Outcome: Progressing   Problem: Nutrition: Goal: Adequate nutrition will be maintained Outcome: Progressing   Problem: Coping: Goal: Level of anxiety will decrease Outcome: Progressing   Problem: Pain Managment: Goal: General experience of comfort will improve Outcome: Progressing   Problem: Safety: Goal: Ability to remain free from injury will improve Outcome: Progressing   

## 2019-09-30 NOTE — Progress Notes (Signed)
CARDIAC REHAB PHASE I   PRE:  Rate/Rhythm: 108 ST  BP:  Supine: 112/76  Sitting:   Standing:    SaO2: 100%RA  MODE:  Ambulation: 750 ft   POST:  Rate/Rhythm: 118 ST  BP:  Supine:   Sitting: 110/83  Standing:    SaO2: 100%RA 1335-1429 Pt walked 750 ft with steady gait and no SOB. Can walk independently. Gave CHF booklet and discussed when to call MD with signs/symptoms of CHF. Discussed importance of 2LFR, 2000 mg sodium restriction and daily weights. Gave written ex guidelines. Pt voiced understanding. Tolerated activity well.   Graylon Good, RN BSN  09/30/2019 2:24 PM

## 2019-09-30 NOTE — Plan of Care (Signed)

## 2019-09-30 NOTE — TOC Progression Note (Addendum)
Transition of Care [facility] - Progression Note    Patient Details  Name: Brandon Huber MRN: [id number] Date of Birth: 09-22-1991  Transition of Care [facility]) CM/SW Contact  Zenon Mayo, RN Phone Number: 09/30/2019, 11:08 AM  Clinical Narrative:    From home with his grand mother which is a temporary home right now, he states he may not be staying in [place].  NCM offered [facility] for CHF , he states he does not want HHRN .  He has insurance with [place] [facility], [place] [id number], plan Silver, pharmacy benefits [id number] [phone], [employer] [place], [place],  [phone].    Provider services phone [phone].  He does not  Have a PCP listed, he states he would like to find his own PCP.         Expected Discharge Plan and Services           Expected Discharge Date: 10/01/19                                     Social Determinants of Health (SDOH) Interventions    Readmission Risk Interventions No flowsheet data found.

## 2019-10-01 ENCOUNTER — Encounter (HOSPITAL_COMMUNITY): Payer: Self-pay | Admitting: Student

## 2019-10-01 LAB — BASIC METABOLIC PANEL
Anion gap: 13 (ref 5–15)
BUN: 10 mg/dL (ref 6–20)
CO2: 31 mmol/L (ref 22–32)
Calcium: 9.1 mg/dL (ref 8.9–10.3)
Chloride: 95 mmol/L — ABNORMAL LOW (ref 98–111)
Creatinine, Ser: 1.12 mg/dL (ref 0.61–1.24)
GFR calc Af Amer: >60 mL/min (ref >60)
GFR calc non Af Amer: >60 mL/min (ref >60)
Glucose, Bld: 105 mg/dL — ABNORMAL HIGH (ref 70–99)
Potassium: 3.9 mmol/L (ref 3.5–5.1)
Sodium: 139 mmol/L (ref 135–145)

## 2019-10-01 LAB — MAGNESIUM: Magnesium: 1.8 mg/dL (ref 1.7–2.4)

## 2019-10-01 MED ORDER — SPIRONOLACTONE 25 MG PO TABS
25.0000 mg | ORAL_TABLET | Freq: Every day | ORAL | Status: DC
  Administered 2019-10-02 – 2019-10-03 (×2): 25 mg via ORAL
  Filled 2019-10-01 (×2): qty 1

## 2019-10-01 MED ORDER — POTASSIUM CHLORIDE CRYS ER 20 MEQ PO TBCR
40.0000 meq | EXTENDED_RELEASE_TABLET | Freq: Two times a day (BID) | ORAL | Status: AC
  Administered 2019-10-01 (×2): 40 meq via ORAL
  Filled 2019-10-01 (×2): qty 2

## 2019-10-01 MED ORDER — SODIUM CHLORIDE 0.9% FLUSH
3.0000 mL | Freq: Two times a day (BID) | INTRAVENOUS | Status: DC
  Administered 2019-10-01 – 2019-10-02 (×4): 3 mL via INTRAVENOUS

## 2019-10-01 MED ORDER — ZOLPIDEM TARTRATE 5 MG PO TABS
5.0000 mg | ORAL_TABLET | Freq: Every evening | ORAL | Status: DC | PRN
  Administered 2019-10-01 – 2019-10-02 (×2): 5 mg via ORAL
  Filled 2019-10-01 (×2): qty 1

## 2019-10-01 MED ORDER — MAGNESIUM SULFATE 2 GM/50ML IV SOLN
2.0000 g | Freq: Once | INTRAVENOUS | Status: AC
  Administered 2019-10-01: 2 g via INTRAVENOUS
  Filled 2019-10-01: qty 50

## 2019-10-01 MED ORDER — SPIRONOLACTONE 12.5 MG HALF TABLET
12.5000 mg | ORAL_TABLET | Freq: Once | ORAL | Status: AC
  Administered 2019-10-01: 12.5 mg via ORAL
  Filled 2019-10-01: qty 1

## 2019-10-01 NOTE — Plan of Care (Signed)
  Problem: Education: Goal: Ability to demonstrate management of disease process will improve Outcome: Progressing   Problem: Activity: Goal: Capacity to carry out activities will improve Outcome: Progressing   Problem: Education: Goal: Knowledge of General Education information will improve Description: Including pain rating scale, medication(s)/side effects and non-pharmacologic comfort measures Outcome: Progressing   Problem: Health Behavior/Discharge Planning: Goal: Ability to manage health-related needs will improve Outcome: Progressing   Problem: Clinical Measurements: Goal: Cardiovascular complication will be avoided Outcome: Progressing   Problem: Activity: Goal: Risk for activity intolerance will decrease Outcome: Progressing   Problem: Nutrition: Goal: Adequate nutrition will be maintained Outcome: Progressing   Problem: Pain Managment: Goal: General experience of comfort will improve Outcome: Progressing   Problem: Safety: Goal: Ability to remain free from injury will improve Outcome: Progressing

## 2019-10-01 NOTE — Progress Notes (Signed)
Patient ID: Brandon Huber, male   DOB: 1991-04-22, 28 y.o.   MRN: [id number]     Advanced Heart Failure Rounding Note  PCP-Cardiologist: Dr. Haroldine Laws   Subjective:    Good diuresis again, weight down ?>10 lbs over the last day.   Not orthopneic.  Has not walked yet this morning.  Says his breathing is "good."    Objective:   Weight Range: 107.8 kg There is no height or weight on file to calculate BMI.   Vital Signs:   Temp:  [97.5 F (36.4 C)-98.4 F (36.9 C)] 97.9 F (36.6 C) (10/03 0805) Pulse Rate:  [95-117] 95 (10/03 0805) Resp:  [15-29] 18 (10/03 0805) BP: (108-116)/(68-99) 108/68 (10/03 0805) SpO2:  [96 %-98 %] 98 % (10/03 0805) Weight:  [107.8 kg] 107.8 kg (10/03 0356) Last BM Date: 09/29/19(Per the Pt)  Weight change: Filed Weights   09/29/19 0500 09/30/19 0214 10/01/19 0356  Weight: 122.4 kg 116.3 kg 107.8 kg    Intake/Output:   Intake/Output Summary (Last 24 hours) at 10/01/2019 1144 Last data filed at 10/01/2019 1100 Gross per 24 hour  Intake 1131.14 ml  Output 5725 ml  Net -4593.86 ml    Physical Exam    General: NAD Neck: JVP 12+ cm, no thyromegaly or thyroid nodule.  Lungs: Clear to auscultation bilaterally with normal respiratory effort. CV: Lateral PMI.  Heart regular S1/S2, no S3/S4, no murmur.  1+ ankle edema.   Abdomen: Soft, nontender, no hepatosplenomegaly, no distention.  Skin: Intact without lesions or rashes.  Neurologic: Alert and oriented x 3.  Psych: Normal affect. Extremities: No clubbing or cyanosis.  HEENT: Normal.    Telemetry   Sinus tach 100s  Personally reviewed  EKG    No new EKG to interpret today  Labs    CBC Recent Labs    09/28/19 1616 09/28/19 1701  WBC 8.9 9.3  HGB 14.5 15.0  HCT 44.7 45.4  MCV 84.3 83.3  PLT 258 937   Basic Metabolic Panel Recent Labs    09/30/19 0148 10/01/19 0223  NA 139 139  K 3.6 3.9  CL 97* 95*  CO2 30 31  GLUCOSE 127* 105*  BUN 9 10  CREATININE 1.09 1.12   CALCIUM 9.0 9.1  MG 1.7 1.8   Liver Function Tests Recent Labs    09/28/19 1616  AST 39  ALT 34  ALKPHOS 73  BILITOT 1.8*  PROT 6.2*  ALBUMIN 3.3*   No results for input(s): LIPASE, AMYLASE in the last 72 hours. Cardiac Enzymes No results for input(s): CKTOTAL, CKMB, CKMBINDEX, TROPONINI in the last 72 hours.  BNP: BNP (last 3 results) Recent Labs    09/28/19 1616  BNP 3,046.4*    ProBNP (last 3 results) No results for input(s): PROBNP in the last 8760 hours.   D-Dimer No results for input(s): DDIMER in the last 72 hours. Hemoglobin A1C No results for input(s): HGBA1C in the last 72 hours. Fasting Lipid Panel No results for input(s): CHOL, HDL, LDLCALC, TRIG, CHOLHDL, LDLDIRECT in the last 72 hours. Thyroid Function Tests Recent Labs    09/28/19 1616  TSH 3.280    Other results: Repeat 2D echo pending  IMPRESSIONS    1. Left ventricular ejection fraction, by visual estimation, is <20%. The left ventricle has severely decreased function. Moderately increased left ventricular size. Left ventricular septal wall thickness was normal. There is no left ventricular  hypertrophy.  2. Left ventricular diastolic Doppler parameters are consistent with pseudonormalization pattern of  LV diastolic filling.  3. Diffuse hypokinesis EF 15%.  4. Global right ventricle has mildly reduced systolic function.The right ventricular size is mildly enlarged. No increase in right ventricular wall thickness.  5. Left atrial size was moderately dilated.  6. Right atrial size was mildly dilated.  7. The mitral valve is normal in structure. Mild mitral valve regurgitation.  8. The tricuspid valve is normal in structure. Tricuspid valve regurgitation moderate.  9. The aortic valve is tricuspid Aortic valve regurgitation was not visualized by color flow Doppler. Mild aortic valve sclerosis without stenosis. 10. The pulmonic valve was grossly normal. Pulmonic valve regurgitation is mild by  color flow Doppler. 11. Normal pulmonary artery systolic pressure.   Imaging    No results found.   Medications:     Scheduled Medications: . digoxin  0.125 mg Oral Daily  . furosemide  80 mg Intravenous BID  . heparin  5,000 Units Subcutaneous Q8H  . influenza vac split quadrivalent PF  0.5 mL Intramuscular Tomorrow-1000  . pneumococcal 23 valent vaccine  0.5 mL Intramuscular Tomorrow-1000  . potassium chloride  40 mEq Oral BID  . sacubitril-valsartan  1 tablet Oral BID  . sodium chloride flush  3 mL Intravenous Q12H  . spironolactone  12.5 mg Oral Once  . [START ON 10/02/2019] spironolactone  25 mg Oral Daily    Infusions: . sodium chloride    . magnesium sulfate bolus IVPB      PRN Medications: sodium chloride, acetaminophen, sodium chloride flush    Patient Profile   28 y/o obese male w/ h/o HTN, tobacco abuse, family h/o CHF (father) and recent diagnosis of systolic HF due to NICM, admitted for acute on chronic systolic HF, in the setting of poor medication compliance.   Assessment/Plan    1. NICM: new diagnosis of DCM 06/2019 at [facility]. Echo w/ severely reduced LVEF at 20% w/ biventricular failure. LHC 06/2019 showed normal coronaries. RHC showed mean wedge of 20, FICK CO/CI 4.02/1.7. ANA negative, ESR low at 5, Lyme disease antibody <0.8. cMRI showed no signs of infiltration but severely dilated LV and RV. TSH here at [facility] WNL. Repeat echo 10/1 showed EF at 15% no LVH, has mildly reduced RV systolic dysfunction w/ normal PA systolic pressure. Mild MR. ? Etiology. ?OSA, hypertensive heart disease vs familial (father w/ CHF) - Still volume overloaded with JVD, continue Lasix 80 mg IV bid (good diuresis so far).  Weight is below baseline, probably was volume overloaded at baseline.  - Treat w/ guidelines directed medical therapy - Entresto 24-26 - Increase spironolactone to 25 mg daily.  - Digoxin 0.125 - No  blocker currently due to suspected low  output  - Will gradually titrate HF regimen to maximum tolerated doses in clinic  - Will ultimately need repeat 2D Echo after 3 months of maximum tolerated medical therapy - If EF remains < 35%, plan to refer to EP for ICD for primary prevention. Consider Lifevest on d/c - Blood type AB+. Would be a good transplant candidate if HF dose not improve w/ medical management.  -Consult placed for CR to ambulate today   2. HTN: controlled on current regimen - Continue Entresto and increase spironolactone - Monitor pressure while diuresing w/ IV lasix  3. Obesity:  - weigh loss advised - needs outpatient sleep study to r/o OSA  4. Tobacco Abuse:  - recently quit 2 months ago  Length of Stay: 3  Loralie Champagne, MD  10/01/2019, 11:44 AM

## 2019-10-01 NOTE — Progress Notes (Addendum)
CARDIAC REHAB PHASE I   PRE:  Rate/Rhythm: 114 ST  BP:  Supine: 128/96 Sitting:   Standing:    SaO2:   MODE:  Ambulation: 750 ft   POST:  Rate/Rhythm: 116 St  BP:  Supine:   Sitting: 104/57  Standing:    SaO2: 98% RA  1410-1445 Patient tolerated ambulation well, very pleasant and eager to walk. Patient  independent with walking, gait steady. Diastolic BP elevated prior to ambulation, within normal limits afterwards.  Pt is interested in participating in Virtual Cardiac and Pulmonary Rehab. Pt advised that Virtual Cardiac and Pulmonary Rehab is provided at no cost to the patient.  Checklist:  1. Pt has smart device  ie smartphone and/or ipad for downloading an app  Yes 2. Reliable internet/wifi service    Yes 3. Understands how to use their smartphone and navigate within an app.  Yes   Pt verbalized understanding and is in agreement.  Sol Passer, MS, ACSM CEP

## 2019-10-02 LAB — BASIC METABOLIC PANEL
Anion gap: 15 (ref 5–15)
BUN: 11 mg/dL (ref 6–20)
CO2: 28 mmol/L (ref 22–32)
Calcium: 9.2 mg/dL (ref 8.9–10.3)
Chloride: 94 mmol/L — ABNORMAL LOW (ref 98–111)
Creatinine, Ser: 1.13 mg/dL (ref 0.61–1.24)
GFR calc Af Amer: >60 mL/min (ref >60)
GFR calc non Af Amer: >60 mL/min (ref >60)
Glucose, Bld: 87 mg/dL (ref 70–99)
Potassium: 3.9 mmol/L (ref 3.5–5.1)
Sodium: 137 mmol/L (ref 135–145)

## 2019-10-02 MED ORDER — TORSEMIDE 20 MG PO TABS
40.0000 mg | ORAL_TABLET | Freq: Every day | ORAL | Status: DC
  Administered 2019-10-03: 40 mg via ORAL
  Filled 2019-10-02: qty 2

## 2019-10-02 MED ORDER — SACUBITRIL-VALSARTAN 49-51 MG PO TABS
1.0000 | ORAL_TABLET | Freq: Two times a day (BID) | ORAL | Status: DC
  Administered 2019-10-02 – 2019-10-03 (×3): 1 via ORAL
  Filled 2019-10-02 (×4): qty 1

## 2019-10-02 MED ORDER — FUROSEMIDE 10 MG/ML IJ SOLN
80.0000 mg | Freq: Two times a day (BID) | INTRAMUSCULAR | Status: AC
  Administered 2019-10-02: 09:00:00 80 mg via INTRAVENOUS
  Filled 2019-10-02: qty 8

## 2019-10-02 NOTE — Progress Notes (Signed)
Patient ID: Brandon Huber, male   DOB: [birth year], 27 y.o.   MRN: [id number]     Advanced Heart Failure Rounding Note  PCP-Cardiologist: Dr. Haroldine Laws   Subjective:    Good diuresis again, weight down 9 more lbs.   Not orthopneic.  No dyspnea walking yesterday.    Objective:   Weight Range: 103.5 kg There is no height or weight on file to calculate BMI.   Vital Signs:   Temp:  [97.9 F (36.6 C)-98.3 F (36.8 C)] 98.3 F (36.8 C) (10/04 0350) Pulse Rate:  [90-106] 91 (10/04 0350) Resp:  [14-24] 24 (10/04 0350) BP: (102-119)/(66-79) 102/66 (10/04 0350) SpO2:  [98 %-100 %] 100 % (10/04 0350) Weight:  [103.5 kg] 103.5 kg (10/04 0350) Last BM Date: 09/29/19(Per the Pt)  Weight change: Filed Weights   09/30/19 0214 10/01/19 0356 10/02/19 0350  Weight: 116.3 kg 107.8 kg 103.5 kg    Intake/Output:   Intake/Output Summary (Last 24 hours) at 10/02/2019 0736 Last data filed at 10/01/2019 2120 Gross per 24 hour  Intake 1440 ml  Output 6800 ml  Net -5360 ml    Physical Exam    General: NAD Neck: JVP 8-9 cm, no thyromegaly or thyroid nodule.  Lungs: Clear to auscultation bilaterally with normal respiratory effort. CV: Nondisplaced PMI.  Heart regular S1/S2, no S3/S4, no murmur.  Trace ankle edema.   Abdomen: Soft, nontender, no hepatosplenomegaly, no distention.  Skin: Intact without lesions or rashes.  Neurologic: Alert and oriented x 3.  Psych: Normal affect. Extremities: No clubbing or cyanosis.  HEENT: Normal.    Telemetry   NSR 90s.  Personally reviewed  EKG    No new EKG to interpret today  Labs    CBC No results for input(s): WBC, NEUTROABS, HGB, HCT, MCV, PLT in the last 72 hours. Basic Metabolic Panel Recent Labs    09/30/19 0148 10/01/19 0223 10/02/19 0310  NA 139 139 137  K 3.6 3.9 3.9  CL 97* 95* 94*  CO2 '30 31 28  ' GLUCOSE 127* 105* 87  BUN '9 10 11  ' CREATININE 1.09 1.12 1.13  CALCIUM 9.0 9.1 9.2  MG 1.7 1.8  --    Liver Function  Tests No results for input(s): AST, ALT, ALKPHOS, BILITOT, PROT, ALBUMIN in the last 72 hours. No results for input(s): LIPASE, AMYLASE in the last 72 hours. Cardiac Enzymes No results for input(s): CKTOTAL, CKMB, CKMBINDEX, TROPONINI in the last 72 hours.  BNP: BNP (last 3 results) Recent Labs    09/28/19 1616  BNP 3,046.4*    ProBNP (last 3 results) No results for input(s): PROBNP in the last 8760 hours.   D-Dimer No results for input(s): DDIMER in the last 72 hours. Hemoglobin A1C No results for input(s): HGBA1C in the last 72 hours. Fasting Lipid Panel No results for input(s): CHOL, HDL, LDLCALC, TRIG, CHOLHDL, LDLDIRECT in the last 72 hours. Thyroid Function Tests No results for input(s): TSH, T4TOTAL, T3FREE, THYROIDAB in the last 72 hours.  Invalid input(s): FREET3  Other results: Repeat 2D echo pending  IMPRESSIONS    1. Left ventricular ejection fraction, by visual estimation, is <20%. The left ventricle has severely decreased function. Moderately increased left ventricular size. Left ventricular septal wall thickness was normal. There is no left ventricular  hypertrophy.  2. Left ventricular diastolic Doppler parameters are consistent with pseudonormalization pattern of LV diastolic filling.  3. Diffuse hypokinesis EF 15%.  4. Global right ventricle has mildly reduced systolic function.The right ventricular size  is mildly enlarged. No increase in right ventricular wall thickness.  5. Left atrial size was moderately dilated.  6. Right atrial size was mildly dilated.  7. The mitral valve is normal in structure. Mild mitral valve regurgitation.  8. The tricuspid valve is normal in structure. Tricuspid valve regurgitation moderate.  9. The aortic valve is tricuspid Aortic valve regurgitation was not visualized by color flow Doppler. Mild aortic valve sclerosis without stenosis. 10. The pulmonic valve was grossly normal. Pulmonic valve regurgitation is mild by color  flow Doppler. 11. Normal pulmonary artery systolic pressure.   Imaging    No results found.   Medications:     Scheduled Medications: . digoxin  0.125 mg Oral Daily  . furosemide  80 mg Intravenous BID  . heparin  5,000 Units Subcutaneous Q8H  . influenza vac split quadrivalent PF  0.5 mL Intramuscular Tomorrow-1000  . pneumococcal 23 valent vaccine  0.5 mL Intramuscular Tomorrow-1000  . sacubitril-valsartan  1 tablet Oral BID  . sodium chloride flush  3 mL Intravenous Q12H  . spironolactone  25 mg Oral Daily    Infusions: . sodium chloride      PRN Medications: sodium chloride, acetaminophen, sodium chloride flush, zolpidem    Patient Profile   28 y/o obese male w/ h/o HTN, tobacco abuse, family h/o CHF (father) and recent diagnosis of systolic HF due to NICM, admitted for acute on chronic systolic HF, in the setting of poor medication compliance.   Assessment/Plan    1. NICM: new diagnosis of DCM 06/2019 at [facility]. Echo w/ severely reduced LVEF at 20% w/ biventricular failure. LHC 06/2019 showed normal coronaries. RHC showed mean wedge of 20, FICK CO/CI 4.02/1.7. ANA negative, ESR low at 5, Lyme disease antibody <0.8. cMRI showed no signs of infiltration but severely dilated LV and RV. TSH here at [facility] WNL. Repeat echo 10/1 showed EF at 15% no LVH, has mildly reduced RV systolic dysfunction w/ normal PA systolic pressure. Mild MR. ? Etiology. ?OSA, hypertensive heart disease vs familial (father w/ CHF) - JVP improved, weight down markedly.  Will give 1 dose IV Lasix this morning then transition to po tomorrow, will write for torsemide 40 mg daily.   - SBP 110s this morning, will increase Entresto to 49/51 bid, will see if BP can tolerate today.  - Continue spironolactone 25 mg daily.  - Digoxin 0.125 - No  blocker currently due to suspected low output  - Will gradually titrate HF regimen to maximum tolerated doses in clinic  - Will ultimately need repeat  2D Echo after 3 months of maximum tolerated medical therapy - If EF remains < 35%, plan to refer to EP for ICD for primary prevention. Consider Lifevest on d/c - Blood type AB+. Would be a good transplant candidate if HF dose not improve w/ medical management.   2. HTN: controlled on current regimen - Monitor pressure while diuresing w/ IV lasix  3. Obesity:  - weigh loss advised - needs outpatient sleep study to r/o OSA  4. Tobacco Abuse:  - recently quit 2 months ago  Watch today with med increase and off IV Lasix, probably home tomorrow.   Length of Stay: 4  Loralie Champagne, MD  10/02/2019, 7:36 AM

## 2019-10-03 LAB — BASIC METABOLIC PANEL
Anion gap: 9 (ref 5–15)
BUN: 10 mg/dL (ref 6–20)
CO2: 30 mmol/L (ref 22–32)
Calcium: 9.5 mg/dL (ref 8.9–10.3)
Chloride: 98 mmol/L (ref 98–111)
Creatinine, Ser: 1.16 mg/dL (ref 0.61–1.24)
GFR calc Af Amer: >60 mL/min (ref >60)
GFR calc non Af Amer: >60 mL/min (ref >60)
Glucose, Bld: 90 mg/dL (ref 70–99)
Potassium: 4.5 mmol/L (ref 3.5–5.1)
Sodium: 137 mmol/L (ref 135–145)

## 2019-10-03 MED ORDER — SPIRONOLACTONE 25 MG PO TABS: 25.0000 mg | ORAL_TABLET | Freq: Every day | ORAL | 6 refills | Status: DC

## 2019-10-03 MED ORDER — TORSEMIDE 20 MG PO TABS: 40.0000 mg | ORAL_TABLET | Freq: Every day | ORAL | 6 refills | Status: DC

## 2019-10-03 MED ORDER — IVABRADINE HCL 5 MG PO TABS: 5.0000 mg | ORAL_TABLET | Freq: Two times a day (BID) | ORAL | 6 refills | Status: DC

## 2019-10-03 MED ORDER — DIGOXIN 125 MCG PO TABS: 0.1250 mg | ORAL_TABLET | Freq: Every day | ORAL | 6 refills | Status: DC

## 2019-10-03 MED ORDER — SACUBITRIL-VALSARTAN 49-51 MG PO TABS: 1.0000 | ORAL_TABLET | Freq: Two times a day (BID) | ORAL | 6 refills | Status: DC

## 2019-10-03 MED ORDER — IVABRADINE HCL 5 MG PO TABS
5.0000 mg | ORAL_TABLET | Freq: Two times a day (BID) | ORAL | Status: DC
  Filled 2019-10-03: qty 1

## 2019-10-03 MED FILL — TORSEMIDE 20 MG TABLET: 20 | 30 days supply | Qty: 60 | Fill #0

## 2019-10-03 MED FILL — SPIRONOLACTONE 25 MG TABLET: 25 | 30 days supply | Qty: 30 | Fill #0

## 2019-10-03 MED FILL — DIGOXIN 0.125 MG TABLET: 125 | 30 days supply | Qty: 30 | Fill #0

## 2019-10-03 NOTE — Plan of Care (Signed)
  Problem: Education: Goal: Ability to demonstrate management of disease process will improve Outcome: Progressing Goal: Ability to verbalize understanding of medication therapies will improve Outcome: Progressing   Problem: Activity: Goal: Capacity to carry out activities will improve Outcome: Progressing   Problem: Cardiac: Goal: Ability to achieve and maintain adequate cardiopulmonary perfusion will improve Outcome: Progressing   Problem: Education: Goal: Knowledge of General Education information will improve Description: Including pain rating scale, medication(s)/side effects and non-pharmacologic comfort measures Outcome: Progressing   Problem: Clinical Measurements: Goal: Will remain free from infection Outcome: Progressing Goal: Cardiovascular complication will be avoided Outcome: Progressing

## 2019-10-03 NOTE — Discharge Summary (Addendum)
Advanced Heart Failure Team  Discharge Summary   Patient ID: Brandon Huber MRN: [id number], DOB/AGE: 05/13/1991 28 y.o. Admit date: 09/28/2019 D/C date:     10/03/2019   Primary Discharge Diagnoses:  1. Acute on chronic systolic HF due to NICM 2. HTN 3. Obesity  4. Tobacco Abuse:   Hospital Course: Brandon Huber is a 28 year old with a history of obesity,hypertension (dx years ago but has not been treated) and tobacco abuse. He presented [facility] on 07/22/19.BNP 2800. Echocardiogram obtained showed an LVEF of less than 25%, severely dilated LV 1-2+ Brandon. Left heart catheterization revealed no significant CAD. RHC showed a mean wedge of 20, Fick CO/CI 4.02/1.7. He was diuresed approximately 20 pounds. He underwent cardiac MRI that showed no signs of infiltration but severely dilated LV and RV. He was placed on carvedilol, Entresto and Lasix at discharge. He was also given a LifeVest.  Admitted from the HF clinic with marked volume overload in the setting of medication non compliance. Diuresed with IV lasix and transitioned to torsemide 40 mg daily. Diuresed 58 pounds. HF medications optimized. He will continue to be followed closely in the HF clinic. He was instructed to bring all medications to his appointment.  He will need patient assistance applications started for entresto and potential ivabradine. He was referred to HF Pharmacy Tech to follow up at his clinic visit. HF fund used for torsemide and spiro. 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 Reinforced the importance of life vest.   1. NICM: new diagnosis of DCM 06/2019 at [facility]. Echo w/ severely reduced LVEF at 20% w/ biventricular failure. LHC 06/2019 showed normal coronaries. RHC showed mean wedge of 20, FICK CO/CI 4.02/1.7. ANA negative, ESR low at 5, Lyme disease antibody <0.8. cMRI showed no signs of infiltration but severely dilated LV and RV. TSH here at [facility] WNL. Repeat echo 10/1 showed  EF at 15% no LVH, has mildly reduced RV systolic dysfunction w/ normal PA systolic pressure. Mild Brandon. ? Etiology. ?OSA, hypertensive heart disease vs familial (father w/ CHF) -  Diuresed with IV lasix and transitioned to torsemide 40 mg daily. Weight has gone down from 281-->223 pounds.  -Continue Entresto to 24-26 bid . Renal function remains stable.  - Continue spironolactone 25 mg daily.  - Digoxin 0.125 - No ? blocker currently due to suspected low output  - Will ultimately need repeat 2D Echo after 3 months of maximum tolerated medical therapy. ( Early January)  - If EF remains < 35%, plan to refer to EP for ICD for primary prevention.  - Blood type AB+. Would be a good transplant candidate if HF dose not improve w/ medical management.   2. HTN: Stable.   3. Obesity:  Needs outpatient sleep study to r/o OSA  4. Tobacco Abuse:  - recently quit 2 months ago  Discharge Weight Range: 223 pounds.  Discharge Vitals: Blood pressure 106/67, pulse 95, temperature 97.8 F (36.6 C), temperature source Oral, resp. rate (!) 22, weight 101.5 kg, SpO2 98 %.  Labs: Lab Results  Component Value Date   WBC 9.3 09/28/2019   HGB 15.0 09/28/2019   HCT 45.4 09/28/2019   MCV 83.3 09/28/2019   PLT 286 09/28/2019    Recent Labs  Lab 09/28/19 1616  10/03/19 0231  NA 132*   < > 137  K 3.7   < > 4.5  CL 99   < > 98  CO2 21*   < > 30  BUN 8   < > 10  CREATININE 1.08   < > 1.16  CALCIUM 8.8*   < > 9.5  PROT 6.2*  --   --   BILITOT 1.8*  --   --   ALKPHOS 73  --   --   ALT 34  --   --   AST 39  --   --   GLUCOSE 118*   < > 90   < > = values in this interval not displayed.   No results found for: CHOL, HDL, LDLCALC, TRIG BNP (last 3 results) Recent Labs    09/28/19 1616  BNP 3,046.4*    ProBNP (last 3 results) No results for input(s): PROBNP in the last 8760 hours.   Diagnostic Studies/Procedures   No results found.  Discharge Medications   Allergies as of 10/03/2019   No  Known Allergies     Medication List    STOP taking these medications   carvedilol 6.25 MG tablet Commonly known as: COREG   furosemide 40 MG tablet Commonly known as: LASIX   potassium chloride 10 MEQ tablet Commonly known as: KLOR-CON     TAKE these medications   digoxin 0.125 MG tablet Commonly known as: LANOXIN Take 1 tablet (0.125 mg total) by mouth daily. Start taking on: October 04, 2019   sacubitril-valsartan 24-26 MG Commonly known as: ENTRESTO Take 1 tablet by mouth 2 (two) times daily.   spironolactone 25 MG tablet Commonly known as: ALDACTONE Take 1 tablet (25 mg total) by mouth daily. Start taking on: October 04, 2019   torsemide 20 MG tablet Commonly known as: DEMADEX Take 2 tablets (40 mg total) by mouth daily. Start taking on: October 04, 2019       Disposition   The patient will be discharged in stable condition to home. Discharge Instructions    (HEART FAILURE PATIENTS) Call MD:  Anytime you have any of the following symptoms: 1) 3 pound weight gain in 24 hours or 5 pounds in 1 week 2) shortness of breath, with or without a dry hacking cough 3) swelling in the hands, feet or stomach 4) if you have to sleep on extra pillows at night in order to breathe.   Complete by: As directed    Amb Referral to Cardiac Rehabilitation   Complete by: As directed    Diagnosis: Heart Failure (see criteria below if ordering Phase II)   Heart Failure Type: Chronic Systolic   After initial evaluation and assessments completed: Virtual Based Care may be provided alone or in conjunction with Phase 2 Cardiac Rehab based on patient barriers.: Yes   Diet - low sodium heart healthy   Complete by: As directed    Heart Failure patients record your daily weight using the same scale at the same time of day   Complete by: As directed    Increase activity slowly   Complete by: As directed      Follow-up Information    [place]  Follow up on 10/11/2019.   Specialty: Cardiology Why: 12:40 Garage Code [id number] Contact information: [address] [id number] [place] [phone]            Duration of Discharge Encounter: Greater than 35 minutes   Signed, Amy Clegg NP-C  10/03/2019, 1:40 PM  Patient seen and examined with Darrick Grinder, NP. We discussed all aspects of the encounter. I  agree with the assessment and plan as stated above.   He is much improved after 50+ pound weight loss. Volume status looks good but still quite tachycardic. Will add ivabradine if we can find a way to help him afford it. Can go home today. Stressed need for close f/u and to start wearing his LifeVest at home.   Glori Bickers, MD  3:07 PM

## 2019-10-03 NOTE — Progress Notes (Addendum)
Patient ID: Brandon Huber, male   DOB: 1991-06-16, 28 y.o.   MRN: [id number]     Advanced Heart Failure Rounding Note  PCP-Cardiologist: Dr. Haroldine Laws   Subjective:   Yesterday entresto was increased to 49-51 mg twice a day and given a dose of IV lasix.   Denies SOB.   Objective:   Weight Range: 101.5 kg There is no height or weight on file to calculate BMI.   Vital Signs:   Temp:  [97.4 F (36.3 C)-98.6 F (37 C)] 98.3 F (36.8 C) (10/05 0739) Pulse Rate:  [87-121] 95 (10/05 0322) Resp:  [17-26] 21 (10/05 0739) BP: (91-118)/(64-74) 118/72 (10/05 0739) SpO2:  [98 %] 98 % (10/05 0322) Weight:  [101.5 kg] 101.5 kg (10/05 0322) Last BM Date: 09/29/19(Per the Pt)  Weight change: Filed Weights   10/01/19 0356 10/02/19 0350 10/03/19 0322  Weight: 107.8 kg 103.5 kg 101.5 kg    Intake/Output:    Intake/Output Summary (Last 24 hours) at 10/03/2019 0827 Last data filed at 10/02/2019 2000 Gross per 24 hour  Intake 800 ml  Output 1000 ml  Net -200 ml    Physical Exam    General:  Well appearing. No resp difficulty HEENT: normal Neck: supple. no JVD. Carotids 2+ bilat; no bruits. No lymphadenopathy or thryomegaly appreciated. Cor: PMI nondisplaced. Regular rate & rhythm. No rubs, gallops or murmurs. Lungs: clear Abdomen: soft, nontender, nondistended. No hepatosplenomegaly. No bruits or masses. Good bowel sounds. Extremities: no cyanosis, clubbing, rash, edema Neuro: alert & orientedx3, cranial nerves grossly intact. moves all 4 extremities w/o difficulty. Affect pleasant  Telemetry  NSR 90-100s   EKG    No new EKG to interpret today  Labs    CBC No results for input(s): WBC, NEUTROABS, HGB, HCT, MCV, PLT in the last 72 hours. Basic Metabolic Panel Recent Labs    10/01/19 0223 10/02/19 0310 10/03/19 0231  NA 139 137 137  K 3.9 3.9 4.5  CL 95* 94* 98  CO2 _0 GLUCOSE 105* 87 90  BUN _1 CREATININE 1.12 1.13 1.16  CALCIUM 9.1 9.2 9.5  MG  1.8  --   --    Liver Function Tests No results for input(s): AST, ALT, ALKPHOS, BILITOT, PROT, ALBUMIN in the last 72 hours. No results for input(s): LIPASE, AMYLASE in the last 72 hours. Cardiac Enzymes No results for input(s): CKTOTAL, CKMB, CKMBINDEX, TROPONINI in the last 72 hours.  BNP: BNP (last 3 results) Recent Labs    09/28/19 1616  BNP 3,046.4*    ProBNP (last 3 results) No results for input(s): PROBNP in the last 8760 hours.   D-Dimer No results for input(s): DDIMER in the last 72 hours. Hemoglobin A1C No results for input(s): HGBA1C in the last 72 hours. Fasting Lipid Panel No results for input(s): CHOL, HDL, LDLCALC, TRIG, CHOLHDL, LDLDIRECT in the last 72 hours. Thyroid Function Tests No results for input(s): TSH, T4TOTAL, T3FREE, THYROIDAB in the last 72 hours.  Invalid input(s): FREET3  Other results: Repeat 2D echo pending  IMPRESSIONS    1. Left ventricular ejection fraction, by visual estimation, is <20%. The left ventricle has severely decreased function. Moderately increased left ventricular size. Left ventricular septal wall thickness was normal. There is no left ventricular  hypertrophy.  2. Left ventricular diastolic Doppler parameters are consistent with pseudonormalization pattern of LV diastolic filling.  3. Diffuse hypokinesis EF 15%.  4. Global right ventricle has mildly reduced systolic function.The right ventricular size  is mildly enlarged. No increase in right ventricular wall thickness.  5. Left atrial size was moderately dilated.  6. Right atrial size was mildly dilated.  7. The mitral valve is normal in structure. Mild mitral valve regurgitation.  8. The tricuspid valve is normal in structure. Tricuspid valve regurgitation moderate.  9. The aortic valve is tricuspid Aortic valve regurgitation was not visualized by color flow Doppler. Mild aortic valve sclerosis without stenosis. 10. The pulmonic valve was grossly normal. Pulmonic  valve regurgitation is mild by color flow Doppler. 11. Normal pulmonary artery systolic pressure.   Imaging    No results found.   Medications:     Scheduled Medications: . digoxin  0.125 mg Oral Daily  . heparin  5,000 Units Subcutaneous Q8H  . influenza vac split quadrivalent PF  0.5 mL Intramuscular Tomorrow-1000  . pneumococcal 23 valent vaccine  0.5 mL Intramuscular Tomorrow-1000  . sacubitril-valsartan  1 tablet Oral BID  . sodium chloride flush  3 mL Intravenous Q12H  . spironolactone  25 mg Oral Daily  . torsemide  40 mg Oral Daily    Infusions: . sodium chloride      PRN Medications: sodium chloride, acetaminophen, sodium chloride flush, zolpidem    Patient Profile   28 y/o obese male w/ h/o HTN, tobacco abuse, family h/o CHF (father) and recent diagnosis of systolic HF due to NICM, admitted for acute on chronic systolic HF, in the setting of poor medication compliance.   Assessment/Plan    1. NICM: new diagnosis of DCM 06/2019 at [facility]. Echo w/ severely reduced LVEF at 20% w/ biventricular failure. LHC 06/2019 showed normal coronaries. RHC showed mean wedge of 20, FICK CO/CI 4.02/1.7. ANA negative, ESR low at 5, Lyme disease antibody <0.8. cMRI showed no signs of infiltration but severely dilated LV and RV. TSH here at [facility] WNL. Repeat echo 10/1 showed EF at 15% no LVH, has mildly reduced RV systolic dysfunction w/ normal PA systolic pressure. Mild MR. ? Etiology. ?OSA, hypertensive heart disease vs familial (father w/ CHF) -  Renal function stable.  Volume status stable. Weight has gone down from 281-->223 pounds. Continue torsemide 40 mg daily.  -Continue Entresto to 49/51 bid  - Continue spironolactone 25 mg daily.  - Digoxin 0.125 - No  blocker currently due to suspected low output  - Will gradually titrate HF regimen to maximum tolerated doses in clinic  - Will ultimately need repeat 2D Echo after 3 months of maximum tolerated medical  therapy - If EF remains < 35%, plan to refer to EP for ICD for primary prevention.  - Consider Lifevest on d/c - Blood type AB+. Would be a good transplant candidate if HF dose not improve w/ medical management.   2. HTN: Stable.   3. Obesity:  Needs outpatient sleep study to r/o OSA  4. Tobacco Abuse:  - recently quit 2 months ago  HF follow has been set up. He has transportation to appointments. Instructed to bring all meds to appointment. Possible D/C today.    Length of Stay: [place], NP  10/03/2019, 8:27 AM   Patient seen and examined with Darrick Grinder, NP. We discussed all aspects of the encounter. I agree with the assessment and plan as stated above.   He is much improved after 50 pound weight loss. Volume status looks good but still quite tachycardic. Will add ivabradine if we can find a way to help him afford it. Can go home today.  Stressed need for close f/u and to start wearing his LifeVest at home.   Glori Bickers, MD  3:07 PM

## 2019-10-03 NOTE — Progress Notes (Signed)
Pt sleeping, sts he didn't receive much sleep last night. Able to perform teach back for sodium restrictions, when to call regarding fluid overload, walking, CRPII, and fluid restrictions. No further questions. Discussed smoking as well however pt sts he previously only smoked occasionally, not daily. He is quit. He has a Teacher, music at home. I encouraged him to wear it if prescribed as he admits to not being completely compliant.  [id number] [place], ACSM .11:24 AM 10/03/2019

## 2019-10-04 ENCOUNTER — Telehealth (HOSPITAL_COMMUNITY): Payer: Self-pay | Admitting: Pharmacy Technician

## 2019-10-04 ENCOUNTER — Telehealth (HOSPITAL_COMMUNITY): Payer: Self-pay | Admitting: Pharmacist

## 2019-10-04 NOTE — Telephone Encounter (Signed)
Patient Advocate Encounter   Received notification from [employer] that prior authorization for Corlanor is required.   PA submitted on CoverMyMeds Key  [id number] Status is pending   Will continue to follow.  Charlann Boxer, CPhT

## 2019-10-04 NOTE — Telephone Encounter (Signed)
Patient recently prescribed Entresto. Copay is $533/mo through his Pharmacist, community. Activated and provided patient's pharmacy with copay card. He has already used his 30-day free trial card. Once he uses up the funds on the copay card, will apply for manufacturer's assistance. Patient aware.   Audry Riles, PharmD, BCPS, CPP Heart Failure Clinic Pharmacist [phone]

## 2019-10-07 NOTE — Telephone Encounter (Signed)
Advanced Heart Failure Patient Advocate Encounter  Prior Authorization for Corlanor 5mg  has been approved.    PA#  [id number] Effective dates: 10/04/2019 through 10/03/2020  Patients co-pay is $454.64  Will route this message to [facility] to see if we are still moving forward with patient taking this medication. If so, will seek manufacturer assistance.  Charlann Boxer, CPhT

## 2019-10-11 ENCOUNTER — Encounter (HOSPITAL_COMMUNITY): Payer: Self-pay | Admitting: Internal Medicine

## 2019-10-13 ENCOUNTER — Other Ambulatory Visit: Payer: Self-pay

## 2019-10-13 ENCOUNTER — Ambulatory Visit (HOSPITAL_COMMUNITY)
Admission: RE | Admit: 2019-10-13 | Discharge: 2019-10-13 | Disposition: A | Discharge status: Home / Self Care | Payer: PRIVATE HEALTH INSURANCE | Source: Ambulatory Visit | Attending: Adult Health | Admitting: Adult Health

## 2019-10-13 VITALS — BP 128/76 | HR 96 | Wt 231.8 lb

## 2019-10-13 DIAGNOSIS — Z79899 Other long term (current) drug therapy: Secondary | ICD-10-CM | POA: Diagnosis not present

## 2019-10-13 DIAGNOSIS — Z9114 Patient's other noncompliance with medication regimen: Secondary | ICD-10-CM | POA: Insufficient documentation

## 2019-10-13 DIAGNOSIS — I081 Rheumatic disorders of both mitral and tricuspid valves: Secondary | ICD-10-CM | POA: Insufficient documentation

## 2019-10-13 DIAGNOSIS — I5022 Chronic systolic (congestive) heart failure: Secondary | ICD-10-CM | POA: Diagnosis present

## 2019-10-13 DIAGNOSIS — I088 Other rheumatic multiple valve diseases: Secondary | ICD-10-CM | POA: Insufficient documentation

## 2019-10-13 DIAGNOSIS — R0683 Snoring: Secondary | ICD-10-CM

## 2019-10-13 DIAGNOSIS — E669 Obesity, unspecified: Secondary | ICD-10-CM | POA: Diagnosis not present

## 2019-10-13 DIAGNOSIS — I110 Hypertensive heart disease with heart failure: Secondary | ICD-10-CM | POA: Diagnosis not present

## 2019-10-13 LAB — BASIC METABOLIC PANEL
Anion gap: 11 (ref 5–15)
BUN: 13 mg/dL (ref 6–20)
CO2: 29 mmol/L (ref 22–32)
Calcium: 9.3 mg/dL (ref 8.9–10.3)
Chloride: 96 mmol/L — ABNORMAL LOW (ref 98–111)
Creatinine, Ser: 1.02 mg/dL (ref 0.61–1.24)
GFR calc Af Amer: >60 mL/min (ref >60)
GFR calc non Af Amer: >60 mL/min (ref >60)
Glucose, Bld: 99 mg/dL (ref 70–99)
Potassium: 4 mmol/L (ref 3.5–5.1)
Sodium: 136 mmol/L (ref 135–145)

## 2019-10-13 MED ORDER — SACUBITRIL-VALSARTAN 49-51 MG PO TABS: 1.0000 | ORAL_TABLET | Freq: Two times a day (BID) | ORAL | 11 refills | Status: DC

## 2019-10-13 NOTE — Progress Notes (Signed)
Advanced Heart Failure Clinic Note   Referring Physician: PCP: Default, Provider, MD PCP-Cardiologist: Glori Bickers, MD   HPI:  Brandon Huber is a 28 year old with a history of obesity,hypertension (dx years ago but has not been treated) and tobacco abuse. He presented to [facility] on 07/22/19.BNP 2800. Echocardiogram obtained showed an LVEF of less than 25%, severely dilated LV 1-2+ Brandon. Left heart catheterization revealed no significant CAD. RHC showed a mean wedge of 20, Fick CO/CI 4.02/1.7. He was diuresed approximately 20 pounds. He underwent cardiac MRI that showed no signs of infiltration but severely dilated LV and RV. He was placed on carvedilol, Entresto and Lasix at discharge. He was also given a LifeVest and referred to the [facility] for further management.   He was seen for the first time in clinic by Dr. Haroldine Laws on 9/30. He presented w/ NYHA IIIB-IV symptoms and marked volume overload in the setting of medication non compliance. He was directed admitted to hospital from clinic. Echo repeated at [facility] showed EF <20%. He was placed on IV lasix. He had good diuresis and did not require inotrope support. He diuresed 58 pounds. He was transitioned to torsemide 40 mg daily.  HF medications optimized. Discharge wt was 101 kg.   He presents back to clinic for post hospital f/u. Doing well. No exertional dyspnea w/ ADLs now with  moderate activity. Weight stable at home. No LEE. Denies orthopnea and PND. Compliant w/ daily weights. No side effects. Avoiding salt.    2D Echo 09/28/19    1. Left ventricular ejection fraction, by visual estimation, is <20%. The left ventricle has severely decreased function. Moderately increased left ventricular size. Left ventricular septal wall thickness was normal. There is no left ventricular  hypertrophy.  2. Left ventricular diastolic Doppler parameters are consistent with pseudonormalization pattern of LV diastolic filling.  3. Diffuse hypokinesis EF 15%.  4. Global right ventricle has mildly reduced systolic function.The right ventricular size is mildly enlarged. No increase in right ventricular wall thickness.  5. Left atrial size was moderately dilated.  6. Right atrial size was mildly dilated.  7. The mitral valve is normal in structure. Mild mitral valve regurgitation.  8. The tricuspid valve is normal in structure. Tricuspid valve regurgitation moderate.  9. The aortic valve is tricuspid Aortic valve regurgitation was not visualized by color flow Doppler. Mild aortic valve sclerosis without stenosis. 10. The pulmonic valve was grossly normal. Pulmonic valve regurgitation is mild by color flow Doppler. 11. Normal pulmonary artery systolic pressure.   Review of Systems: [y] = yes, '[ ]'  = no   General: Weight gain '[ ]' ; Weight loss '[ ]' ; Anorexia '[ ]' ; Fatigue '[ ]' ; Fever '[ ]' ; Chills '[ ]' ; Weakness '[ ]'   Cardiac: Chest pain/pressure '[ ]' ; Resting SOB '[ ]' ; Exertional SOB '[ ]' ; Orthopnea '[ ]' ; Pedal Edema '[ ]' ; Palpitations '[ ]' ; Syncope '[ ]' ; Presyncope '[ ]' ; Paroxysmal nocturnal dyspnea'[ ]'   Pulmonary: Cough '[ ]' ; Wheezing'[ ]' ; Hemoptysis'[ ]' ; Sputum '[ ]' ; Snoring '[ ]'   GI: Vomiting'[ ]' ; Dysphagia'[ ]' ; Melena'[ ]' ; Hematochezia '[ ]' ; Heartburn'[ ]' ; Abdominal pain '[ ]' ; Constipation '[ ]' ; Diarrhea '[ ]' ; BRBPR '[ ]'   GU: Hematuria'[ ]' ; Dysuria '[ ]' ; Nocturia'[ ]'   Vascular: Pain in legs with walking '[ ]' ; Pain in feet with lying flat '[ ]' ; Non-healing sores '[ ]' ; Stroke '[ ]' ; TIA '[ ]' ; Slurred speech '[ ]' ;  Neuro: Headaches'[ ]' ; Vertigo'[ ]' ; Seizures'[ ]' ; Paresthesias'[ ]' ;Blurred vision '[ ]' ; Diplopia '[ ]' ;  Vision changes '[ ]'   Ortho/Skin: Arthritis '[ ]' ; Joint pain '[ ]' ; Muscle pain '[ ]' ; Joint swelling '[ ]' ; Back Pain '[ ]' ; Rash '[ ]'   Psych: Depression'[ ]' ; Anxiety'[ ]'   Heme: Bleeding problems '[ ]' ; Clotting disorders '[ ]' ; Anemia '[ ]'   Endocrine: Diabetes '[ ]' ; Thyroid dysfunction'[ ]'    No past medical history on file.  Current Outpatient Medications  Medication Sig Dispense Refill  . digoxin  (LANOXIN) 0.125 MG tablet Take 1 tablet (0.125 mg total) by mouth daily. 30 tablet 6  . spironolactone (ALDACTONE) 25 MG tablet Take 1 tablet (25 mg total) by mouth daily. 30 tablet 6  . torsemide (DEMADEX) 20 MG tablet Take 2 tablets (40 mg total) by mouth daily. 60 tablet 6  . sacubitril-valsartan (ENTRESTO) 49-51 MG Take 1 tablet by mouth 2 (two) times daily. 60 tablet 11   No current facility-administered medications for this encounter.     No Known Allergies    Social History   Socioeconomic History  . Marital status: Unknown    Spouse name: Not on file  . Number of children: Not on file  . Years of education: Not on file  . Highest education level: Not on file  Occupational History  . Not on file  Social Needs  . Financial resource strain: Not on file  . Food insecurity    Worry: Not on file    Inability: Not on file  . Transportation needs    Medical: Not on file    Non-medical: Not on file  Tobacco Use  . Smoking status: Never Smoker  . Smokeless tobacco: Never Used  Substance and Sexual Activity  . Alcohol use: Never    Frequency: Never  . Drug use: Not on file  . Sexual activity: Not on file  Lifestyle  . Physical activity    Days per week: Not on file    Minutes per session: Not on file  . Stress: Not on file  Relationships  . Social Herbalist on phone: Not on file    Gets together: Not on file    Attends religious service: Not on file    Active member of club or organization: Not on file    Attends meetings of clubs or organizations: Not on file    Relationship status: Not on file  . Intimate partner violence    Fear of current or ex partner: Not on file    Emotionally abused: Not on file    Physically abused: Not on file    Forced sexual activity: Not on file  Other Topics Concern  . Not on file  Social History Narrative  . Not on file     No family history on file.  Vitals:   10/13/19 1109  BP: 128/76  Pulse: 96  SpO2: 98%   Weight: 105.1 kg (231 lb 12.8 oz)     PHYSICAL EXAM: General:  Well appearing. No respiratory difficulty HEENT: normal Neck: supple. no JVD. Carotids 2+ bilat; no bruits. No lymphadenopathy or thyromegaly appreciated. Cor: PMI nondisplaced. Regular rate & rhythm. No rubs, gallops or murmurs. Lungs: clear Abdomen: soft, nontender, nondistended. No hepatosplenomegaly. No bruits or masses. Good bowel sounds. Extremities: no cyanosis, clubbing, rash, edema Neuro: alert & oriented x 3, cranial nerves grossly intact. moves all 4 extremities w/o difficulty. Affect pleasant.  ECG:   ASSESSMENT & PLAN:  1. 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 Echo w/ severely reduced LVEF  at 20% w/ biventricular failure. LHC 06/2019 showed normal coronaries. RHC showed mean wedge of 20, FICK CO/CI 4.02/1.7. ANA negative, ESR low at 5, Lyme disease antibody <0.8. cMRI showed no signs of infiltration but severely dilated LV and RV. TSH WNL. Repeat echo at [facility] 10/1 showed EF at 15% no LVH, has mildly reduced RV systolic dysfunction w/ normal PA systolic pressure. Mild Brandon. ? Etiology. ?OSA, hypertensive heart disease vs familial (father w/ CHF) -Massive diuresis during recent hospitalization w/ IV lasix and transitioned to torsemide 40 mg daily.  -trace LEE on exam. Wt up is up 8 lb from recent hospital d/c wt (223>>231lb) NYHA Class II.  -Continue torsemide 40 mg daily -Check BMP today   -Plan to increaseEntresto to 49-51 bid. - Continue spironolactone 25 mg daily.  - Continue Digoxin 0.125 - Will ultimately need repeat 2D Echo after 3 months of maximum tolerated medical therapy. ( Early January)  - If EF remains < 35%, plan to refer to EP for ICD for primary prevention. - Blood type AB+. Would be a good transplant candidate if HF dose not improve w/ medical management.   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  -increasing Entresto to 49-51 bid -continue torsemide and spiro   3. Obesity: - Will  order home sleep study to r/o OSA  4. Tobacco Abuse:  - recently quit 2 months ago  F/u in 3 weeks for further titration of HF regimen    Lyda Jester, PA-C 10/13/19

## 2019-10-13 NOTE — Patient Instructions (Signed)
Lab work done today. We will notify you of any abnormal lab work. No news is good news!  INCREASE Entresto 49/51mg  dose. One tab two times daily.  Please follow up with the [facility] in 3 weeks.   At the [facility], you and your health needs are our priority. As part of our continuing mission to provide you with exceptional heart care, we have created designated Provider Care Teams. These Care Teams include your primary Cardiologist (physician) and Advanced Practice Providers (APPs- Physician Assistants and Nurse Practitioners) who all work together to provide you with the care you need, when you need it.   You may see any of the following providers on your designated Care Team at your next follow up: Marland Kitchen Dr Glori Bickers . Dr Loralie Champagne . Darrick Grinder, NP . Lyda Jester, PA   Please be sure to bring in all your medications bottles to every appointment.

## 2019-10-14 ENCOUNTER — Other Ambulatory Visit (HOSPITAL_COMMUNITY): Payer: Self-pay

## 2019-10-14 DIAGNOSIS — R0683 Snoring: Secondary | ICD-10-CM

## 2019-10-14 NOTE — Progress Notes (Signed)
Patient Name:         DOB:       Height:     Weight:  Office Name:         Referring Provider:  Today's Date:  Date:   STOP BANG RISK ASSESSMENT S (snore) Have you been told that you snore?     YES   T (tired) Are you often tired, fatigued, or sleepy during the day?   YES  O (obstruction) Do you stop breathing, choke, or gasp during sleep? YES   P (pressure) Do you have or are you being treated for high blood pressure? NO   B (BMI) Is your body index greater than 35 kg/m? YES   A (age) Are you 65 years old or older? NO   N (neck) Do you have a neck circumference greater than 16 inches?   NO   G (gender) Are you a male? YES   TOTAL STOP/BANG "YES" ANSWERS                                                                        For Office Use Only              Procedure Order Form    YES to 3+ Stop Bang questions OR two clinical symptoms - patient qualifies for WatchPAT (CPT 95800)     Submit: This Form + Patient Face Sheet + Clinical Note via [employer] or Fax: [phone]         Clinical Notes: Will consult Sleep Specialist and refer for management of therapy due to patient increased risk of Sleep Apnea. Ordering a sleep study due to the following two clinical symptoms: Excessive daytime sleepiness G47.10 / Loud snoring R06.83  I understand that I am proceeding with a home sleep apnea test as ordered by my treating physician. I understand that untreated sleep apnea is a serious cardiovascular risk factor and it is my responsibility to perform the test and seek management for sleep apnea. I will be contacted with the results and be managed for sleep apnea by a local sleep physician. I will be receiving equipment and further instructions from [facility]. I shall promptly ship back the equipment via the included mailing label. I understand my insurance will be billed for the test and as the patient I am responsible for any insurance related out-of-pocket costs incurred. I have  been provided with written instructions and can call for additional video or telephonic instruction, with 24-hour availability of qualified personnel to answer any questions: Patient Help Desk [phone].

## 2019-10-14 NOTE — Addendum Note (Signed)
Encounter addended by: Marlise Eves, RN on: 10/14/2019 9:52 AM  Actions taken: Visit diagnoses modified

## 2019-10-14 NOTE — Progress Notes (Signed)
Per Brittainy PA-C pt is to have a home sleep study for snoring, obesity, and heart failure. Orders placed. stopbang completed. Will be faxed once orders signed.

## 2019-10-17 NOTE — Progress Notes (Signed)
Order, OV note, stop bang and demographics all faxed to [facility] at [phone] via epic  

## 2019-10-19 ENCOUNTER — Telehealth (HOSPITAL_COMMUNITY): Payer: Self-pay

## 2019-10-19 NOTE — Telephone Encounter (Signed)
Faxed Stop bang and Height and Weight to [facility] @ [phone] on 10/19/2019. 2nd Attempt

## 2019-10-20 ENCOUNTER — Telehealth (HOSPITAL_COMMUNITY): Payer: Self-pay

## 2019-10-20 NOTE — Telephone Encounter (Signed)
Called and spoke with pt in regards to our VCR program, pt stated he was not at home at the time. But he would give [place] a call back later today. If pt does not return phone call by 10/21/2019, will reach back out to pt the 1st of next week.

## 2019-10-24 ENCOUNTER — Telehealth (HOSPITAL_COMMUNITY): Payer: Self-pay

## 2019-10-24 ENCOUNTER — Encounter (HOSPITAL_COMMUNITY): Payer: Self-pay

## 2019-10-24 NOTE — Telephone Encounter (Signed)
Attempted to call patient in regards to Cardiac Rehab/VCR - pt Vm not set up yet. Unable to leave a VM.  Mailed letter

## 2019-11-04 ENCOUNTER — Ambulatory Visit (HOSPITAL_COMMUNITY)
Admission: RE | Admit: 2019-11-04 | Discharge: 2019-11-04 | Disposition: A | Discharge status: Home / Self Care | Payer: PRIVATE HEALTH INSURANCE | Source: Ambulatory Visit | Attending: Internal Medicine | Admitting: Internal Medicine

## 2019-11-04 ENCOUNTER — Other Ambulatory Visit: Payer: Self-pay

## 2019-11-04 ENCOUNTER — Encounter (HOSPITAL_COMMUNITY): Payer: Self-pay | Admitting: Internal Medicine

## 2019-11-04 VITALS — BP 124/88 | HR 85 | Wt 242.4 lb

## 2019-11-04 DIAGNOSIS — R0683 Snoring: Secondary | ICD-10-CM | POA: Diagnosis not present

## 2019-11-04 DIAGNOSIS — Z79899 Other long term (current) drug therapy: Secondary | ICD-10-CM | POA: Insufficient documentation

## 2019-11-04 DIAGNOSIS — I5082 Biventricular heart failure: Secondary | ICD-10-CM | POA: Insufficient documentation

## 2019-11-04 DIAGNOSIS — E669 Obesity, unspecified: Secondary | ICD-10-CM | POA: Diagnosis not present

## 2019-11-04 DIAGNOSIS — I5022 Chronic systolic (congestive) heart failure: Secondary | ICD-10-CM | POA: Diagnosis present

## 2019-11-04 DIAGNOSIS — I110 Hypertensive heart disease with heart failure: Secondary | ICD-10-CM | POA: Diagnosis not present

## 2019-11-04 DIAGNOSIS — I10 Essential (primary) hypertension: Secondary | ICD-10-CM | POA: Diagnosis not present

## 2019-11-04 DIAGNOSIS — Z87891 Personal history of nicotine dependence: Secondary | ICD-10-CM | POA: Insufficient documentation

## 2019-11-04 DIAGNOSIS — I428 Other cardiomyopathies: Secondary | ICD-10-CM | POA: Diagnosis not present

## 2019-11-04 LAB — BASIC METABOLIC PANEL
Anion gap: 13 (ref 5–15)
BUN: 10 mg/dL (ref 6–20)
CO2: 27 mmol/L (ref 22–32)
Calcium: 9.3 mg/dL (ref 8.9–10.3)
Chloride: 93 mmol/L — ABNORMAL LOW (ref 98–111)
Creatinine, Ser: 0.94 mg/dL (ref 0.61–1.24)
GFR calc Af Amer: >60 mL/min (ref >60)
GFR calc non Af Amer: >60 mL/min (ref >60)
Glucose, Bld: 112 mg/dL — ABNORMAL HIGH (ref 70–99)
Potassium: 3.6 mmol/L (ref 3.5–5.1)
Sodium: 133 mmol/L — ABNORMAL LOW (ref 135–145)

## 2019-11-04 LAB — DIGOXIN LEVEL: Digoxin Level: 0.3 ng/mL — ABNORMAL LOW (ref 0.8–2.0)

## 2019-11-04 LAB — BRAIN NATRIURETIC PEPTIDE: B Natriuretic Peptide: 457.7 pg/mL — ABNORMAL HIGH (ref 0.0–100.0)

## 2019-11-04 MED ORDER — CARVEDILOL 3.125 MG PO TABS: 3.1250 mg | ORAL_TABLET | Freq: Two times a day (BID) | ORAL | 3 refills | Status: DC

## 2019-11-04 NOTE — Patient Instructions (Signed)
Start Carvedilol 3.125 mg Twice daily   Labs done today, we will notify you for abnormal results  Please follow up with our heart failure pharmacist in 2 weeks  Your physician recommends that you schedule a follow-up appointment in: 2 months with echocardiogram  If you have any questions or concerns before your next appointment please send [place] a message through [place] or call our office at [phone].  At the [facility], you and your health needs are our priority. As part of our continuing mission to provide you with exceptional heart care, we have created designated Provider Care Teams. These Care Teams include your primary Cardiologist (physician) and Advanced Practice Providers (APPs- Physician Assistants and Nurse Practitioners) who all work together to provide you with the care you need, when you need it.   You may see any of the following providers on your designated Care Team at your next follow up: Marland Kitchen Dr Glori Bickers . Dr Loralie Champagne . Darrick Grinder, NP . Lyda Jester, PA   Please be sure to bring in all your medications bottles to every appointment.

## 2019-11-04 NOTE — Progress Notes (Signed)
Advanced Heart Failure Clinic Note   Referring Physician: PCP: Default, Provider, MD PCP-Cardiologist: Glori Bickers, MD   HPI:  [name] is a 28 year old with a history of obesity,hypertension (dx years ago but has not been treated) and tobacco abuse. He presented to [facility] on 07/22/19.BNP 2800. Echocardiogram obtained showed an LVEF of less than 25%, severely dilated LV 1-2+ MR. Left heart catheterization revealed no significant CAD. RHC showed a mean wedge of 20, Fick CO/CI 4.02/1.7. He was diuresed approximately 20 pounds. He underwent cardiac MRI that showed no signs of infiltration but severely dilated LV and RV. He was placed on carvedilol, Entresto and Lasix at discharge. He was also given a LifeVest and referred to the [facility] for further management.   He was seen for the first time in clinic by Dr. Haroldine Laws on 9/30. He presented w/ NYHA IIIB-IV symptoms and marked volume overload in the setting of medication non compliance. He was directed admitted to hospital from clinic. Echo repeated at [facility] showed EF <20%. He was placed on IV lasix. He had good diuresis and did not require inotrope support. He diuresed 58 pounds. He was transitioned to torsemide 40 mg daily.  HF medications optimized. Discharge wt was 101 kg.   He presents back to clinic for f/u. At last visit Entresto increased to 49/51 bid and sleep study ordered. Says he feels good. Doing all activity without problem. Walking more. Even able to break into a slight jog. No CP or SOB. Wearing Lifevest. No significant dizziness. Weight fluctuates 235-242. Taking all meds as directed    2D Echo 09/28/19    1. Left ventricular ejection fraction, by visual estimation, is <20%. The left ventricle has severely decreased function. Moderately increased left ventricular size. Left ventricular septal wall thickness was normal. There is no left ventricular  hypertrophy.  2. Left ventricular diastolic Doppler parameters are consistent  with pseudonormalization pattern of LV diastolic filling.  3. Diffuse hypokinesis EF 15%.  4. Global right ventricle has mildly reduced systolic function.The right ventricular size is mildly enlarged. No increase in right ventricular wall thickness.  5. Left atrial size was moderately dilated.  6. Right atrial size was mildly dilated.  7. The mitral valve is normal in structure. Mild mitral valve regurgitation.  8. The tricuspid valve is normal in structure. Tricuspid valve regurgitation moderate.  9. The aortic valve is tricuspid Aortic valve regurgitation was not visualized by color flow Doppler. Mild aortic valve sclerosis without stenosis. 10. The pulmonic valve was grossly normal. Pulmonic valve regurgitation is mild by color flow Doppler. 11. Normal pulmonary artery systolic pressure.    Current Outpatient Medications  Medication Sig Dispense Refill  . digoxin (LANOXIN) 0.125 MG tablet Take 1 tablet (0.125 mg total) by mouth daily. 30 tablet 6  . sacubitril-valsartan (ENTRESTO) 49-51 MG Take 1 tablet by mouth 2 (two) times daily. 60 tablet 11  . spironolactone (ALDACTONE) 25 MG tablet Take 1 tablet (25 mg total) by mouth daily. 30 tablet 6  . torsemide (DEMADEX) 20 MG tablet Take 2 tablets (40 mg total) by mouth daily. 60 tablet 6   No current facility-administered medications for this encounter.     No Known Allergies    Social History   Socioeconomic History  . Marital status: Unknown    Spouse name: Not on file  . Number of children: Not on file  . Years of education: Not on file  . Highest education level: Not on file  Occupational History  .  Not on file  Social Needs  . Financial resource strain: Not on file  . Food insecurity    Worry: Not on file    Inability: Not on file  . Transportation needs    Medical: Not on file    Non-medical: Not on file  Tobacco Use  . Smoking status: Never Smoker  . Smokeless tobacco: Never Used  Substance and Sexual Activity   . Alcohol use: Never    Frequency: Never  . Drug use: Not on file  . Sexual activity: Not on file  Lifestyle  . Physical activity    Days per week: Not on file    Minutes per session: Not on file  . Stress: Not on file  Relationships  . Social Herbalist on phone: Not on file    Gets together: Not on file    Attends religious service: Not on file    Active member of club or organization: Not on file    Attends meetings of clubs or organizations: Not on file    Relationship status: Not on file  . Intimate partner violence    Fear of current or ex partner: Not on file    Emotionally abused: Not on file    Physically abused: Not on file    Forced sexual activity: Not on file  Other Topics Concern  . Not on file  Social History Narrative  . Not on file    Vitals:   11/04/19 1058  BP: 124/88  Pulse: 85  SpO2: 99%  Weight: 110 kg (242 lb 6.4 oz)     PHYSICAL EXAM: General:  Well appearing. No resp difficulty HEENT: normal Neck: supple. no JVD. Carotids 2+ bilat; no bruits. No lymphadenopathy or thryomegaly appreciated. Cor: PMI nondisplaced. Regular rate & rhythm. No rubs, gallops or murmurs. Lungs: clear Abdomen: obese soft, nontender, nondistended. No hepatosplenomegaly. No bruits or masses. Good bowel sounds. Extremities: no cyanosis, clubbing, rash, edema Neuro: alert & orientedx3, cranial nerves grossly intact. moves all 4 extremities w/o difficulty. Affect pleasant  ASSESSMENT & PLAN:  1. NICM: new diagnosis of DCM 06/2019 at [facility]. Echo w/ severely reduced LVEF at 20% w/ biventricular failure. LHC 06/2019 showed normal coronaries. RHC showed mean wedge of 20, FICK CO/CI 4.02/1.7. ANA negative, ESR low at 5, Lyme disease antibody <0.8. cMRI showed no signs of infiltration but severely dilated LV and RV. TSH WNL. Repeat echo at [facility] 10/1 showed EF at 15% no LVH, has mildly reduced RV systolic dysfunction w/ normal PA systolic pressure. Mild MR.  ? Etiology. ?OSA, hypertensive heart disease vs familial (father w/ CHF) -Much improved NYHA I-II - Volume status looks good -Continue torsemide 40 mg daily - ContinueEntresto  49-51 bid. - Continue spironolactone 25 mg daily.  - Continue Digoxin 0.125 - Add carvedilol 3.125 bid - Refer to [facility] for med titration q2 weeks x 3  - Return to see me in 2-3 months with echo - If EF remains < 35%, plan to refer to EP for ICD for primary prevention.Continue LifeVest for now.  - Blood type AB+. Would be a good transplant candidate if HF dose not improve w/ medical management.  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  -Blood pressure well controlled. Meds as above  3. Obesity: - await results of home sleep study  4. Tobacco Abuse:  - recently quit   Glori Bickers, MD 11/04/19

## 2019-11-10 NOTE — Telephone Encounter (Signed)
No response from pt regarding CR.  Closed referral.  

## 2019-11-21 ENCOUNTER — Telehealth (HOSPITAL_COMMUNITY): Payer: Self-pay

## 2019-11-21 NOTE — Telephone Encounter (Signed)
Received a notice that [facility] tried 3 times to contact Patient with no luck. I called Patient, no answer and voicemail was full.

## 2019-11-23 NOTE — Progress Notes (Addendum)
Referring Physician: PCP: Default, Provider, MD PCP-Cardiologist: Daniel Bensimhon, MD   HPI:  Mr Hofmeister is a 28 year old with a history ofobesity,hypertension (dx years ago but has not been treated) and tobacco abuse. He presented to [facility] on 07/22/19.BNP 2800. Echocardiogram obtained showed an LVEF of less than 25%, severely dilated LV 1-2+ MR. Left heart catheterization revealed no significant CAD. RHC showed a mean wedge of 20, Fick CO/CI 4.02/1.7. He was diuresed approximately 20 pounds. He underwent cardiac MRI that showed no signs of infiltration but severely dilated LV and RV. He was placed on carvedilol, Entresto and Lasix at discharge. He was also given a LifeVest and referred to the [facility] for further management.   He was seen for the first time in clinic by Dr. Bensimhon on 09/28/19. He presented w/ NYHA IIIB-IV symptoms and marked volume overload in the setting of medication non compliance. He was directly admitted to hospital from clinic. Echo repeated at [facility] showed EF <20%. He was placed on IV lasix. He had good diuresis and did not require inotrope support. He diuresed 58 pounds. He was transitioned to torsemide 40 mg daily.  HF medications optimized. Discharge wt was 101 kg.   He recently presented to HF Clinic for follow up on 11/04/19.  Stated he was well. Doing all activity without problem. Walking more. Even able to break into a slight jog. No CP or SOB. Wearing Lifevest. No significant dizziness. Weight fluctuated between 235-242 lbs. Taking all meds as directed.   Today he returns to HF clinic for pharmacist medication titration. At last visit with MD, carvedilol 3.125 mg BID was initiated. Unfortunately, he was only taking carvedilol daily instead of BID because he misread the instructions. Overall he is doing well. Compliant with all medications. He was unable to pick up the Entresto 49/51 mg strength, but was appropriately taking 2 tablets of the 24/26 mg strength to equal his  prescribed dose. No dizziness, lightheadedness, chest pain or palpitations. Does note some fatigue, but attributes this to his insomnia, not his medications. His breathing is good, no SOB/DOE. He tries to remain active, walking anywhere from 1-3 miles daily. His weight tends to fluctuate from 243-250 lbs and this is stable. No LEE, PND or orthopnea. His appetite is ok. He does monitor his salt and fluid intake.      . Shortness of breath/dyspnea on exertion? no  . Orthopnea/PND? no . Edema? no . Lightheadedness/dizziness? no . Daily weights at home? yes . Blood pressure/heart rate monitoring at home? no . Following low-sodium/fluid-restricted diet? yes  HF Medications: Carvedilol 3.125 mg BID Entresto 49/51 mg BID Spironolactone 25 mg daily Digoxin 0.125 mg daily Torsemide 40 mg daily  Has the patient been experiencing any side effects to the medications prescribed?  no  Does the patient have any problems obtaining medications due to transportation or finances?   Has commercial insurance so is able to use copay card for Entresto. However, since his copay is still ~$500/month, the copay card will not last the entire year. He is aware that he needs to call us once the copay card runs out of funds so we can apply for manufacturer's assistance.   Understanding of regimen: good Understanding of indications: good Potential of compliance: good Patient understands to avoid NSAIDs. Patient understands to avoid decongestants.    Pertinent Lab Values: . Serum creatinine 0.89, BUN 10, Potassium 2.8, Sodium 134, BNP 930.2  Vital Signs: . Weight: 250 lbs . Blood pressure: 126/84  .   Heart rate: 95   Assessment: 1. NICM: new diagnosis of DCM 06/2019 at [facility]. Echo w/ severely reduced LVEF at 20% w/ biventricular failure. LHC 06/2019 showed normal coronaries. RHC showed mean wedge of 20, FICK CO/CI 4.02/1.7. ANA negative, ESR low at 5, Lyme disease antibody <0.8. cMRI showed no  signs of infiltration but severely dilated LV and RV. TSH WNL. Repeat echo at [facility] 10/1 showed EF at 15% no LVH, has mildly reduced RV systolic dysfunction w/ normal PA systolic pressure. Mild MR. ? Etiology. ?OSA, hypertensive heart disease vs familial (father w/ CHF) -Much improved NYHA I-II.  -Appears euvolemic on exam; however, BNP increased from 457.7 on 11/04/19 to 930.2 today. Hopefully will get some additional diuresis with increase in Entresto (see below). Plan on repeating BNP next week and obtaining ReDS reading. Will increase torsemide at that time if not improved.  - Labs: Scr stable at 0.89, K low at 2.8. Will have him take potassium chloride 60 mEq x3 doses, then take 40 mEq daily. Repeat BMET in 2 weeks. - Continue torsemide 40 mg daily for now.  - Continue carvedilol 3.125 mg BID - IncreaseEntresto to 97/103 mg BID. Repeat BMET in 2 weeks.  - Continue spironolactone 25 mg daily.  - Continue Digoxin 0.125 mg daily. Digoxin level 0.3 ng/mL on 11/04/19.  - Would be a good Bidil candidate. Will consider initiating at next visit.  - Return to see Dr. Bensimhon in 2-3 months with echo - If EF remains < 35%, plan to refer to EP for ICD for primary prevention.Continue LifeVest for now.  - Blood type AB+. Would be a good transplant candidate if HF dose not improve w/ medical management.  - labs today  2. HTN:Stable.128/78.  -Blood pressure well controlled. Meds as above  3. Obesity: - await results of home sleep study  4. Tobacco Abuse:  - recently quit   Plan: 1) Medication changes: Based on clinical presentation, vital signs and recent labs will Increase Entresto to 97/103 mg BID. Will also take Potassium chloride 60 mEq x3 doses, then start 40 mEq daily.  2) Labs: Scr 0.89, K 2.8 3) Follow-up: BMET/BNP Monday of next week, then return in 2 weeks for Pharmacy Clinic.   Lauren Kemp, PharmD, BCPS, BCCP, CPP Heart Failure Clinic Pharmacist [phone]'  

## 2019-12-01 ENCOUNTER — Ambulatory Visit (HOSPITAL_COMMUNITY)
Admission: RE | Admit: 2019-12-01 | Discharge: 2019-12-01 | Disposition: A | Discharge status: Home / Self Care | Payer: PRIVATE HEALTH INSURANCE | Source: Ambulatory Visit | Attending: Cardiology | Admitting: Cardiology

## 2019-12-01 ENCOUNTER — Other Ambulatory Visit: Payer: Self-pay

## 2019-12-01 VITALS — BP 126/84 | HR 95 | Wt 250.0 lb

## 2019-12-01 DIAGNOSIS — Z79899 Other long term (current) drug therapy: Secondary | ICD-10-CM | POA: Diagnosis not present

## 2019-12-01 DIAGNOSIS — E669 Obesity, unspecified: Secondary | ICD-10-CM | POA: Insufficient documentation

## 2019-12-01 DIAGNOSIS — Z87891 Personal history of nicotine dependence: Secondary | ICD-10-CM | POA: Diagnosis not present

## 2019-12-01 DIAGNOSIS — I5022 Chronic systolic (congestive) heart failure: Secondary | ICD-10-CM | POA: Diagnosis present

## 2019-12-01 DIAGNOSIS — Z7901 Long term (current) use of anticoagulants: Secondary | ICD-10-CM | POA: Insufficient documentation

## 2019-12-01 DIAGNOSIS — I110 Hypertensive heart disease with heart failure: Secondary | ICD-10-CM | POA: Diagnosis not present

## 2019-12-01 DIAGNOSIS — I428 Other cardiomyopathies: Secondary | ICD-10-CM | POA: Insufficient documentation

## 2019-12-01 LAB — BASIC METABOLIC PANEL
Anion gap: 13 (ref 5–15)
BUN: 10 mg/dL (ref 6–20)
CO2: 27 mmol/L (ref 22–32)
Calcium: 9.4 mg/dL (ref 8.9–10.3)
Chloride: 94 mmol/L — ABNORMAL LOW (ref 98–111)
Creatinine, Ser: 0.89 mg/dL (ref 0.61–1.24)
GFR calc Af Amer: >60 mL/min (ref >60)
GFR calc non Af Amer: >60 mL/min (ref >60)
Glucose, Bld: 92 mg/dL (ref 70–99)
Potassium: 2.8 mmol/L — ABNORMAL LOW (ref 3.5–5.1)
Sodium: 134 mmol/L — ABNORMAL LOW (ref 135–145)

## 2019-12-01 LAB — BRAIN NATRIURETIC PEPTIDE: B Natriuretic Peptide: 930.2 pg/mL — ABNORMAL HIGH (ref 0.0–100.0)

## 2019-12-01 MED ORDER — SACUBITRIL-VALSARTAN 97-103 MG PO TABS: 1.0000 | ORAL_TABLET | Freq: Two times a day (BID) | ORAL | 11 refills | Status: DC

## 2019-12-01 MED ORDER — POTASSIUM CHLORIDE CRYS ER 20 MEQ PO TBCR: EXTENDED_RELEASE_TABLET | ORAL | 3 refills | Status: DC

## 2019-12-01 NOTE — Addendum Note (Signed)
Encounter addended by: Orma Render, RPH-CPP on: 12/01/2019 5:09 PM  Actions taken: Clinical Note Signed

## 2019-12-01 NOTE — Patient Instructions (Signed)
It was a pleasure seeing you today!  MEDICATIONS: -We are changing your medications today -Increase Entresto to 97/103 mg (1 tab) twice daily.  -Call if you have questions about your medications.  LABS: -We will call you if your labs need attention.  NEXT APPOINTMENT: Return to clinic in 2 weeks with Pharmacy Clinic.  In general, to take care of your heart failure: -Limit your fluid intake to 2 Liters (half-gallon) per day.   -Limit your salt intake to ideally 2-3 grams (2000-3000 mg) per day. -Weigh yourself daily and record, and bring that "weight diary" to your next appointment.  (Weight gain of 2-3 pounds in 1 day typically means fluid weight.) -The medications for your heart are to help your heart and help you live longer.   -Please contact us before stopping any of your heart medications.  Call the clinic at [phone] with questions or to reschedule future appointments.  

## 2019-12-02 ENCOUNTER — Telehealth (HOSPITAL_COMMUNITY): Payer: Self-pay

## 2019-12-02 NOTE — Telephone Encounter (Signed)
[place] Sleep Study Voided after 3 Attempts to contact. 

## 2019-12-05 ENCOUNTER — Other Ambulatory Visit (HOSPITAL_COMMUNITY): Payer: Self-pay | Admitting: *Deleted

## 2019-12-05 ENCOUNTER — Ambulatory Visit (HOSPITAL_COMMUNITY)
Admission: RE | Admit: 2019-12-05 | Discharge: 2019-12-05 | Disposition: A | Discharge status: Home / Self Care | Payer: 59 | Source: Ambulatory Visit | Attending: Internal Medicine | Admitting: Internal Medicine

## 2019-12-05 ENCOUNTER — Other Ambulatory Visit: Payer: Self-pay

## 2019-12-05 DIAGNOSIS — I5022 Chronic systolic (congestive) heart failure: Secondary | ICD-10-CM | POA: Diagnosis present

## 2019-12-05 LAB — BASIC METABOLIC PANEL
Anion gap: 12 (ref 5–15)
BUN: 10 mg/dL (ref 6–20)
CO2: 28 mmol/L (ref 22–32)
Calcium: 8.6 mg/dL — ABNORMAL LOW (ref 8.9–10.3)
Chloride: 97 mmol/L — ABNORMAL LOW (ref 98–111)
Creatinine, Ser: 1.02 mg/dL (ref 0.61–1.24)
GFR calc Af Amer: >60 mL/min (ref >60)
GFR calc non Af Amer: >60 mL/min (ref >60)
Glucose, Bld: 108 mg/dL — ABNORMAL HIGH (ref 70–99)
Potassium: 3.2 mmol/L — ABNORMAL LOW (ref 3.5–5.1)
Sodium: 137 mmol/L (ref 135–145)

## 2019-12-05 LAB — BRAIN NATRIURETIC PEPTIDE: B Natriuretic Peptide: 348.2 pg/mL — ABNORMAL HIGH (ref 0.0–100.0)

## 2019-12-05 NOTE — Progress Notes (Unsigned)
ReDS Vest / Clip - 12/05/19 1400      ReDS Vest / Clip   Station Marker  D    Ruler Value  32    ReDS Value Range  Moderate volume overload    ReDS Actual Value  39    Anatomical Comments  sitting

## 2019-12-06 ENCOUNTER — Telehealth (HOSPITAL_COMMUNITY): Payer: Self-pay | Admitting: Pharmacist

## 2019-12-06 MED ORDER — SPIRONOLACTONE 25 MG PO TABS: 50.0000 mg | ORAL_TABLET | Freq: Every day | ORAL | 6 refills | Status: DC

## 2019-12-06 MED ORDER — POTASSIUM CHLORIDE CRYS ER 20 MEQ PO TBCR: EXTENDED_RELEASE_TABLET | ORAL | 3 refills | Status: DC

## 2019-12-06 NOTE — Telephone Encounter (Signed)
Received patient's lab values from 12/05/19. Scr 1.02, K 3.2, BNP improved from 930 to 348. After discussion with Dr. Haroldine Laws, will have him take an extra 40 mEq of potassium chloride tonight, then increase to potassium chloride 60 mEq daily. Will also increase spironolactone to 50 mg daily. Repeat labs next week in clinic. Unable to speak with patient, left voicemail on his mother's phone.   Audry Riles, PharmD, BCPS, BCCP, CPP Heart Failure Clinic Pharmacist [phone]

## 2019-12-09 NOTE — Progress Notes (Addendum)
Referring Physician: PCP: Default, Provider, MD PCP-Cardiologist: Glori Bickers, MD   HPI:  [name] is a 28 year old with a history ofobesity,hypertension (dx years ago but has not been treated) and tobacco abuse. He presented to [facility] on 07/22/19.BNP 2800. Echocardiogram obtained showed an LVEF of less than 25%, severely dilated LV 1-2+ MR. Left heart catheterization revealed no significant CAD. RHC showed a mean wedge of 20, Fick CO/CI 4.02/1.7. He was diuresed approximately 20 pounds. He underwent cardiac MRI that showed no signs of infiltration but severely dilated LV and RV. He was placed on carvedilol, Entresto and Lasix at discharge. He was also given a LifeVest and referred to the [facility] for further management.   He was seen for the first time in clinic by Dr. Haroldine Laws on 09/28/19. He presented w/ NYHA IIIB-IV symptoms and marked volume overload in the setting of medication non compliance. He was directly admitted to hospital from clinic. Echo repeated at [facility] showed EF <20%. He was placed on IV lasix. He had good diuresis and did not require inotrope support. He diuresed 58 pounds. He was transitioned to torsemide 40 mg daily.  HF medications optimized. Discharge wt was 101 kg.   Returned to HF Clinic for follow up on 11/04/19.  Stated he was well. Doing all activity without problem. Walking more. Even able to break into a slight jog. No CP or SOB. Wearing Lifevest. No significant dizziness. Weight fluctuated between 235-242 lbs. Taking all meds as directed.   He presented to HF Clinic for pharmacist medication titration on 12/01/19. At that visit, he was doing well. Compliant with all medications. He was unable to pick up the Entresto 49/51 mg strength, but was appropriately taking 2 tablets of the 24/26 mg strength to equal his prescribed dose. No dizziness, lightheadedness, chest pain or palpitations. Did note some fatigue, but attributed this to his insomnia, not his medications. His  breathing was good, no SOB/DOE. He tried to remain active, walking anywhere from 1-3 miles daily. His weight tended to fluctuate from 243-250 lbs and this had been stable. No LEE, PND or orthopnea. His appetite was ok. He reported monitoring his salt and fluid intake.   Today he returns to HF clinic for pharmacist medication titration. At last visit, Delene Loll was increased to 97/103 mg BID and Potassium 40 mEq daily was added for low potassium values. The following Monday he returned for lab draw. Since his potassium was still low at 3.2, spironolactone was increased to 50 mg daily and potassium chloride was increased to 60 mEq daily. However he did not make the change as he did not check his voicemail. Today he reports feeling well. No dizziness, lightheadedness, chest pain or palpitations. His breathing is good, no complaints of SOB/DOE. He has been taking torsemide 40 mg daily. His weight has been increasing at home (usually around 150 lbs but was 157 lbs yesterday). However, he did not take extra torsemide or call the office because he knew he had a visit today and we would discuss it. He is up 10 lbs from last clinic visit based on clinic scale. JVD 7 cm above the clavicle. No LEE, PND or orthopnea. ReDS vest reading was 41%. SBP elevated to 140 mmHg today; however, he has not take his medications yet today.   . Shortness of breath/dyspnea on exertion? no  . Orthopnea/PND? no . Edema? no . Lightheadedness/dizziness? no . Daily weights at home? Yes - weight has been increasing . Blood pressure/heart rate monitoring  at home? no . Following low-sodium/fluid-restricted diet? yes  HF Medications: Carvedilol 3.125 mg BID Entresto 97/103 mg BID Spironolactone 25 mg daily Digoxin 0.125 mg daily Torsemide 40 mg daily Potassium chloride 40 mEq daily  Has the patient been experiencing any side effects to the medications prescribed?  no  Does the patient have any problems obtaining medications due to  transportation or finances?   Has commercial insurance so is able to use copay card for [employer]. However, since his copay is still ~$500/month, the copay card will not last the entire year. He is aware that he needs to call us once the copay card runs out of funds so we can apply for manufacturer's assistance.   Understanding of regimen: good Understanding of indications: good Potential of compliance: good Patient understands to avoid NSAIDs. Patient understands to avoid decongestants.    Pertinent Lab Values: . Serum creatinine 1.03, BUN 5, Potassium 3.7, Sodium 140, BNP 348.2 pg/mL (12/15/19)  Vital Signs: . Weight: 260.4 lbs (last clinic weight 250 lbs) . Blood pressure: 140/104 . Heart rate: 103   Assessment: 1. NICM: new diagnosis of DCM 06/2019 at [facility]. Echo w/ severely reduced LVEF at 20% w/ biventricular failure. LHC 06/2019 showed normal coronaries. RHC showed mean wedge of 20, FICK CO/CI 4.02/1.7. ANA negative, ESR low at 5, Lyme disease antibody <0.8. cMRI showed no signs of infiltration but severely dilated LV and RV. TSH WNL. Repeat echo at [facility] 10/1 showed EF at 15% no LVH, has mildly reduced RV systolic dysfunction w/ normal PA systolic pressure. Mild MR. ? Etiology. ?OSA, hypertensive heart disease vs familial (father w/ CHF) -Much improved NYHA I-II.  - Volume overloaded on exam, ReDS 41%, JVD 7 cm above the clavicle, 10 lb weight gain. Increase torsemide to 40 mg every morning and 20 mg every evening. Repeat BMET in 5 days. - Labs: Scr 1.03, K 3.7 - Continue carvedilol 3.125 mg BID - ContinueEntresto 97/103 mg BID.  - Increase spironolactone to 50 mg daily as previously instructed.  - Continue Digoxin 0.125 mg daily. Digoxin level 0.3 ng/mL on 11/04/19.  - Would be a good Bidil candidate. Will consider initiating at next visit.  - Return to see Dr. Haroldine Laws 3 weeks with echo - If EF remains < 35%, plan to refer to EP for ICD for primary  prevention.Continue LifeVest for now.  - Blood type AB+. Would be a good transplant candidate if HF dose not improve w/ medical management.   2. XLK:GMWNUUVO, 140/104 mmHg but did not take medications this morning -Increase spironolactone to 50 mg daily. Continue Entresto and carvedilol.   3. Obesity: - await results of home sleep study. Was having difficulties with insurance, now resolved.   4. Tobacco Abuse:  - recently quit   Plan: 1) Medication changes: Based on clinical presentation, vital signs and recent labs will Increase torsemide to 40 mg every morning and 20 mg every evening. Increase spironolactone to 50 mg daily. Repeat BMET and ReDS reading in 5 days. 2) Labs: Scr 1.03, K 3.7 3) Follow-up: Repeat BMET in 1 week. HF Clinic in 3 weeks with Dr. Haroldine Laws.   Audry Riles, PharmD, BCPS, BCCP, CPP Heart Failure Clinic Pharmacist [phone]'

## 2019-12-15 ENCOUNTER — Other Ambulatory Visit: Payer: Self-pay

## 2019-12-15 ENCOUNTER — Ambulatory Visit (HOSPITAL_COMMUNITY)
Admission: RE | Admit: 2019-12-15 | Discharge: 2019-12-15 | Disposition: A | Discharge status: Home / Self Care | Payer: PRIVATE HEALTH INSURANCE | Source: Ambulatory Visit | Attending: Cardiology | Admitting: Cardiology

## 2019-12-15 VITALS — BP 140/104 | HR 103 | Wt 260.4 lb

## 2019-12-15 DIAGNOSIS — E669 Obesity, unspecified: Secondary | ICD-10-CM | POA: Diagnosis not present

## 2019-12-15 DIAGNOSIS — Z87891 Personal history of nicotine dependence: Secondary | ICD-10-CM | POA: Insufficient documentation

## 2019-12-15 DIAGNOSIS — I428 Other cardiomyopathies: Secondary | ICD-10-CM | POA: Insufficient documentation

## 2019-12-15 DIAGNOSIS — Z79899 Other long term (current) drug therapy: Secondary | ICD-10-CM | POA: Insufficient documentation

## 2019-12-15 DIAGNOSIS — I5023 Acute on chronic systolic (congestive) heart failure: Secondary | ICD-10-CM

## 2019-12-15 DIAGNOSIS — I110 Hypertensive heart disease with heart failure: Secondary | ICD-10-CM | POA: Insufficient documentation

## 2019-12-15 DIAGNOSIS — Z7901 Long term (current) use of anticoagulants: Secondary | ICD-10-CM | POA: Insufficient documentation

## 2019-12-15 LAB — BASIC METABOLIC PANEL
Anion gap: 10 (ref 5–15)
BUN: 5 mg/dL — ABNORMAL LOW (ref 6–20)
CO2: 25 mmol/L (ref 22–32)
Calcium: 8.9 mg/dL (ref 8.9–10.3)
Chloride: 105 mmol/L (ref 98–111)
Creatinine, Ser: 1.03 mg/dL (ref 0.61–1.24)
GFR calc Af Amer: >60 mL/min (ref >60)
GFR calc non Af Amer: >60 mL/min (ref >60)
Glucose, Bld: 101 mg/dL — ABNORMAL HIGH (ref 70–99)
Potassium: 3.7 mmol/L (ref 3.5–5.1)
Sodium: 140 mmol/L (ref 135–145)

## 2019-12-15 MED ORDER — TORSEMIDE 20 MG PO TABS: ORAL_TABLET | ORAL | 6 refills | Status: DC

## 2019-12-15 NOTE — Patient Instructions (Addendum)
It was a pleasure seeing you today!  MEDICATIONS: -We are changing your medications today  -Increase torsemide to 40 mg every morning and 20 mg every evening -Increase spironolactone to 50 mg (2 tabs) daily -Call if you have questions about your medications.  LABS: -We will call you if your labs need attention.  NEXT APPOINTMENT: Return to clinic in 1 week for labs and 3 weeks with Dr. Haroldine Laws.  In general, to take care of your heart failure: -Limit your fluid intake to 2 Liters (half-gallon) per day.   -Limit your salt intake to ideally 2-3 grams (2000-3000 mg) per day. -Weigh yourself daily and record, and bring that "weight diary" to your next appointment.  (Weight gain of 2-3 pounds in 1 day typically means fluid weight.) -The medications for your heart are to help your heart and help you live longer.   -Please contact us before stopping any of your heart medications.  Call the clinic at [phone] with questions or to reschedule future appointments.

## 2019-12-20 ENCOUNTER — Other Ambulatory Visit: Payer: Self-pay

## 2019-12-20 ENCOUNTER — Other Ambulatory Visit (HOSPITAL_COMMUNITY): Payer: Self-pay

## 2019-12-20 ENCOUNTER — Ambulatory Visit (HOSPITAL_COMMUNITY)
Admission: RE | Admit: 2019-12-20 | Discharge: 2019-12-20 | Disposition: A | Discharge status: Home / Self Care | Payer: 59 | Source: Ambulatory Visit | Attending: Internal Medicine | Admitting: Internal Medicine

## 2019-12-20 DIAGNOSIS — I5022 Chronic systolic (congestive) heart failure: Secondary | ICD-10-CM

## 2019-12-20 LAB — BASIC METABOLIC PANEL
Anion gap: 13 (ref 5–15)
BUN: 7 mg/dL (ref 6–20)
CO2: 24 mmol/L (ref 22–32)
Calcium: 8.9 mg/dL (ref 8.9–10.3)
Chloride: 99 mmol/L (ref 98–111)
Creatinine, Ser: 1.3 mg/dL — ABNORMAL HIGH (ref 0.61–1.24)
GFR calc Af Amer: >60 mL/min (ref >60)
GFR calc non Af Amer: >60 mL/min (ref >60)
Glucose, Bld: 105 mg/dL — ABNORMAL HIGH (ref 70–99)
Potassium: 3.6 mmol/L (ref 3.5–5.1)
Sodium: 136 mmol/L (ref 135–145)

## 2019-12-20 NOTE — Addendum Note (Signed)
Encounter addended by: Kerry Dory, CMA on: 12/20/2019 9:02 AM  Actions taken: Clinical Note Signed

## 2019-12-20 NOTE — Addendum Note (Signed)
Encounter addended by: Kerry Dory, CMA on: 12/20/2019 8:57 AM  Actions taken: Flowsheet accepted

## 2019-12-20 NOTE — Progress Notes (Signed)
Patient presents for REDS reading   ReDS Vest / Clip - 12/20/19 0800      ReDS Vest / Clip   Station Marker  D    Ruler Value  35    ReDS Value Range  (!) High volume overload    ReDS Actual Value  41       Weight today 241 Compliant with medications torsemide 40 daily, spiro 25 daily, entresto 49/51 BID   Denies edema, SOB, other HF related symptoms  Advised will forward note and reds reading to provider, diuretics maybe increased once lab results are received.

## 2020-01-04 ENCOUNTER — Ambulatory Visit (HOSPITAL_COMMUNITY)
Admission: RE | Admit: 2020-01-04 | Discharge: 2020-01-04 | Disposition: A | Discharge status: Home / Self Care | Payer: 59 | Source: Ambulatory Visit | Attending: Internal Medicine | Admitting: Internal Medicine

## 2020-01-04 ENCOUNTER — Ambulatory Visit (HOSPITAL_BASED_OUTPATIENT_CLINIC_OR_DEPARTMENT_OTHER)
Admission: RE | Admit: 2020-01-04 | Discharge: 2020-01-04 | Disposition: A | Discharge status: Home / Self Care | Payer: 59 | Source: Ambulatory Visit | Attending: Internal Medicine | Admitting: Internal Medicine

## 2020-01-04 ENCOUNTER — Other Ambulatory Visit: Payer: Self-pay

## 2020-01-04 ENCOUNTER — Encounter (HOSPITAL_COMMUNITY): Payer: Self-pay | Admitting: Internal Medicine

## 2020-01-04 ENCOUNTER — Other Ambulatory Visit (HOSPITAL_COMMUNITY): Payer: Self-pay | Admitting: Internal Medicine

## 2020-01-04 VITALS — BP 116/74 | HR 86 | Wt 261.8 lb

## 2020-01-04 DIAGNOSIS — I10 Essential (primary) hypertension: Secondary | ICD-10-CM

## 2020-01-04 DIAGNOSIS — R0683 Snoring: Secondary | ICD-10-CM

## 2020-01-04 DIAGNOSIS — I5023 Acute on chronic systolic (congestive) heart failure: Secondary | ICD-10-CM

## 2020-01-04 DIAGNOSIS — I5022 Chronic systolic (congestive) heart failure: Secondary | ICD-10-CM | POA: Diagnosis not present

## 2020-01-04 LAB — BASIC METABOLIC PANEL
Anion gap: 16 — ABNORMAL HIGH (ref 5–15)
BUN: 8 mg/dL (ref 6–20)
CO2: 25 mmol/L (ref 22–32)
Calcium: 8.9 mg/dL (ref 8.9–10.3)
Chloride: 97 mmol/L — ABNORMAL LOW (ref 98–111)
Creatinine, Ser: 0.98 mg/dL (ref 0.61–1.24)
GFR calc Af Amer: >60 mL/min (ref >60)
GFR calc non Af Amer: >60 mL/min (ref >60)
Glucose, Bld: 115 mg/dL — ABNORMAL HIGH (ref 70–99)
Potassium: 3.1 mmol/L — ABNORMAL LOW (ref 3.5–5.1)
Sodium: 138 mmol/L (ref 135–145)

## 2020-01-04 LAB — BRAIN NATRIURETIC PEPTIDE: B Natriuretic Peptide: 1020.5 pg/mL — ABNORMAL HIGH (ref 0.0–100.0)

## 2020-01-04 MED ORDER — DAPAGLIFLOZIN PROPANEDIOL 10 MG PO TABS: 10.0000 mg | ORAL_TABLET | Freq: Every day | ORAL | 11 refills | Status: DC

## 2020-01-04 MED ORDER — CARVEDILOL 6.25 MG PO TABS: 6.2500 mg | ORAL_TABLET | Freq: Two times a day (BID) | ORAL | 3 refills | Status: DC

## 2020-01-04 NOTE — Progress Notes (Signed)
Advanced Heart Failure Clinic Note   Referring Physician: PCP: Default, Provider, MD PCP-Cardiologist: Brandon Meres, MD   HPI:  [name] is a 29 year old with a history of obesity,hypertension (dx years ago but has not been treated) and tobacco abuse. He presented to [facility] on 07/22/19 with SOB. Echocardiogram  EF < 25%, severely dilated LV 1-2+ MR. Left heart catheterization revealed no significant CAD. RHC showed a mean wedge of 20, Fick CO/CI 4.02/1.7. He was diuresed approximately 20 pounds. He underwent cardiac MRI that showed no signs of infiltration but severely dilated LV and RV. He was placed on carvedilol, Entresto and Lasix at discharge. He was also given a LifeVest and referred to the [facility] for further management.   He was seen for the first time in clinic by Dr. Gala Huber on 09/28/19. He presented w/ NYHA IIIB-IV symptoms and marked volume overload in the setting of medication non compliance. He was directed admitted to hospital from clinic. Echo repeated at [facility] showed EF <20%. He was placed on IV lasix. He had good diuresis and did not require inotrope support. He diuresed 58 pounds. He was transitioned to torsemide 40 mg daily.  HF medications optimized. Discharge wt was 101 kg. He has been following in PharmD Clinic for med titration.   He presents back to clinic for f/u. Says he is doing fairly well. Doing some walking. Can walk 2 miles before his legs get tired. Tries to walk 2-3x/week for at least 1/2 mile. No SOB or CP. Taking all meds. Weight was around 240 now 255-260. Says he is eating too much. Doesn't feel like it is fluid. No edema, orthopnea or PND.     2D Echo 09/28/19    1. Left ventricular ejection fraction, by visual estimation, is <20%. The left ventricle has severely decreased function. Moderately increased left ventricular size. Left ventricular septal wall thickness was normal. There is no left ventricular  hypertrophy.  2. Left ventricular diastolic  Doppler parameters are consistent with pseudonormalization pattern of LV diastolic filling.  3. Diffuse hypokinesis EF 15%.  4. Global right ventricle has mildly reduced systolic function.The right ventricular size is mildly enlarged. No increase in right ventricular wall thickness.  5. Left atrial size was moderately dilated.  6. Right atrial size was mildly dilated.  7. The mitral valve is normal in structure. Mild mitral valve regurgitation.  8. The tricuspid valve is normal in structure. Tricuspid valve regurgitation moderate.  9. The aortic valve is tricuspid Aortic valve regurgitation was not visualized by color flow Doppler. Mild aortic valve sclerosis without stenosis. 10. The pulmonic valve was grossly normal. Pulmonic valve regurgitation is mild by color flow Doppler. 11. Normal pulmonary artery systolic pressure.    Current Outpatient Medications  Medication Sig Dispense Refill  . carvedilol (COREG) 3.125 MG tablet Take 1 tablet (3.125 mg total) by mouth 2 (two) times daily. 60 tablet 3  . digoxin (LANOXIN) 0.125 MG tablet Take 1 tablet (0.125 mg total) by mouth daily. 30 tablet 6  . potassium chloride SA (KLOR-CON M20) 20 MEQ tablet Take 60 mEq (3 tabs) daily. 90 tablet 3  . sacubitril-valsartan (ENTRESTO) 97-103 MG Take 1 tablet by mouth 2 (two) times daily. 60 tablet 11  . spironolactone (ALDACTONE) 25 MG tablet Take 2 tablets (50 mg total) by mouth daily. 60 tablet 6  . torsemide (DEMADEX) 20 MG tablet Take 40 mg (2 tabs) every morning and 20 mg (1 tab) every evening 90 tablet 6   No  current facility-administered medications for this encounter.    No Known Allergies    Social History   Socioeconomic History  . Marital status: Unknown    Spouse name: Not on file  . Number of children: Not on file  . Years of education: Not on file  . Highest education level: Not on file  Occupational History  . Not on file  Tobacco Use  . Smoking status: Never Smoker  .  Smokeless tobacco: Never Used  Substance and Sexual Activity  . Alcohol use: Never  . Drug use: Not on file  . Sexual activity: Not on file  Other Topics Concern  . Not on file  Social History Narrative  . Not on file   Social Determinants of Health   Financial Resource Strain:   . Difficulty of Paying Living Expenses: Not on file  Food Insecurity:   . Worried About Charity fundraiser in the Last Year: Not on file  . Ran Out of Food in the Last Year: Not on file  Transportation Needs:   . Lack of Transportation (Medical): Not on file  . Lack of Transportation (Non-Medical): Not on file  Physical Activity:   . Days of Exercise per Week: Not on file  . Minutes of Exercise per Session: Not on file  Stress:   . Feeling of Stress : Not on file  Social Connections:   . Frequency of Communication with Friends and Family: Not on file  . Frequency of Social Gatherings with Friends and Family: Not on file  . Attends Religious Services: Not on file  . Active Member of Clubs or Organizations: Not on file  . Attends Archivist Meetings: Not on file  . Marital Status: Not on file  Intimate Partner Violence:   . Fear of Current or Ex-Partner: Not on file  . Emotionally Abused: Not on file  . Physically Abused: Not on file  . Sexually Abused: Not on file    Vitals:   01/04/20 1151  BP: 116/74  Pulse: 86  SpO2: 97%  Weight: 118.8 kg (261 lb 12.8 oz)     PHYSICAL EXAM: General:  Well appearing. No resp difficulty HEENT: normal Neck: supple. no obvious JVD. Carotids 2+ bilat; no bruits. No lymphadenopathy or thryomegaly appreciated. Cor: PMI nondisplaced. Regular rate & rhythm. No rubs, gallops or murmurs. Lungs: clear Abdomen: obese soft, nontender, nondistended. No hepatosplenomegaly. No bruits or masses. Good bowel sounds. Extremities: no cyanosis, clubbing, rash, trace edema Neuro: alert & orientedx3, cranial nerves grossly intact. moves all 4 extremities w/o  difficulty. Affect pleasant  ASSESSMENT & PLAN:  1. Chronic systolic HF:  Diagnosed with NICM 06/2019 at [facility]. Echo w/ severely reduced LVEF at 20% w/ biventricular failure. LHC 06/2019 showed normal coronaries. RHC showed mean wedge of 20, FICK CO/CI 4.02/1.7. cMRI showed no signs of infiltration but severely dilated LV and RV.  Repeat echo at [facility] 09/29/19 showed EF at 15% no LVH, has mildly reduced RV systolic dysfunction w/ normal PA systolic pressure. Mild MR.  - ? Etiology. ?OSA, hypertensive heart disease vs familial (father w/ CHF) - Improved NYHA II  - Echo 01/04/20 (today) EF 20-25% slightly improved from previous (coimpared side-to-side and showed him and his mom) - Volume status looks mildly elevated on torsemide 40/20. Can go to 40 bid as needed (Adding [place] today)  -Continue torsemide 40 mg daily - ContinueEntresto  49-51 bid. - Continue spironolactone 25 mg daily.  - Continue Digoxin 0.125 -  Continue carvedilol 3.125 bid - Add Farxiga 10 daily - Long discussion about pros/cons of proceeding with ICD or waiting another few months to see if EF will continue to recover. Has LifeVest but wearing only intermittently.  - Blood type AB+. Would be a good transplant candidate if HF dose not improve w/ medical management.  - labs today  2. HTN: - Blood pressure well controlled. Continue current regimen.  3. Obesity: - unable to afford home PSG (no insurance) - will schedule in lab sleeps study. Suspect he has significant OSA  4. Tobacco Abuse:  - recently quit   Brandon Meres, MD 01/04/20

## 2020-01-04 NOTE — Progress Notes (Signed)
  Echocardiogram 2D Echocardiogram has been performed.  Delcie Roch 01/04/2020, 1:04 PM

## 2020-01-04 NOTE — Patient Instructions (Signed)
Lab work done today. We will notify you of any abnormal lab work. No news is good news!  START Farxiga 10mg  tab daily.  INCREASE Carvedilol 6.25mg  tab two times daily.  Your physician has recommended that you have a sleep study. This test records several body functions during sleep, including: brain activity, eye movement, oxygen and carbon dioxide blood levels, heart rate and rhythm, breathing rate and rhythm, the flow of air through your mouth and nose, snoring, body muscle movements, and chest and belly movement. Someone will contact you in order to schedule this.  Please follow up with the [facility] in 2-3 months.  At the [facility], you and your health needs are our priority. As part of our continuing mission to provide you with exceptional heart care, we have created designated Provider Care Teams. These Care Teams include your primary Cardiologist (physician) and Advanced Practice Providers (APPs- Physician Assistants and Nurse Practitioners) who all work together to provide you with the care you need, when you need it.   You may see any of the following providers on your designated Care Team at your next follow up: Dr Marland Kitchen . Dr Arvilla Meres . Marca Ancona, NP . Tonye Becket, PA . Robbie Lis, PharmD   Please be sure to bring in all your medications bottles to every appointment.

## 2020-01-19 ENCOUNTER — Other Ambulatory Visit (HOSPITAL_COMMUNITY): Payer: PRIVATE HEALTH INSURANCE | Attending: Cardiology

## 2020-01-22 ENCOUNTER — Ambulatory Visit (HOSPITAL_BASED_OUTPATIENT_CLINIC_OR_DEPARTMENT_OTHER): Payer: PRIVATE HEALTH INSURANCE | Attending: Internal Medicine | Admitting: Cardiology

## 2020-01-27 ENCOUNTER — Telehealth (HOSPITAL_COMMUNITY): Payer: Self-pay | Admitting: *Deleted

## 2020-01-27 NOTE — Telephone Encounter (Signed)
Received form to renew pt's [employer] LifeVest.  Form completed and signed by Dr Gala Romney and faxed along with most recent OV note to [employer] at [phone]

## 2020-02-04 ENCOUNTER — Other Ambulatory Visit (HOSPITAL_COMMUNITY): Payer: PRIVATE HEALTH INSURANCE | Attending: Cardiology

## 2020-02-06 ENCOUNTER — Encounter (HOSPITAL_BASED_OUTPATIENT_CLINIC_OR_DEPARTMENT_OTHER): Payer: PRIVATE HEALTH INSURANCE | Admitting: Cardiology

## 2020-02-06 ENCOUNTER — Other Ambulatory Visit (HOSPITAL_COMMUNITY)
Admission: RE | Admit: 2020-02-06 | Discharge: 2020-02-06 | Disposition: A | Discharge status: Home / Self Care | Payer: 59 | Source: Ambulatory Visit | Attending: Cardiology | Admitting: Cardiology

## 2020-02-06 ENCOUNTER — Other Ambulatory Visit: Payer: PRIVATE HEALTH INSURANCE

## 2020-02-06 DIAGNOSIS — Z20822 Contact with and (suspected) exposure to covid-19: Secondary | ICD-10-CM | POA: Diagnosis not present

## 2020-02-06 DIAGNOSIS — Z01812 Encounter for preprocedural laboratory examination: Secondary | ICD-10-CM | POA: Diagnosis present

## 2020-02-06 LAB — SARS CORONAVIRUS 2 (TAT 6-24 HRS): SARS Coronavirus 2: NEGATIVE

## 2020-02-08 ENCOUNTER — Other Ambulatory Visit: Payer: Self-pay

## 2020-02-08 ENCOUNTER — Ambulatory Visit (HOSPITAL_BASED_OUTPATIENT_CLINIC_OR_DEPARTMENT_OTHER): Payer: 59 | Attending: Internal Medicine | Admitting: Cardiology

## 2020-02-08 DIAGNOSIS — I5022 Chronic systolic (congestive) heart failure: Secondary | ICD-10-CM | POA: Insufficient documentation

## 2020-02-09 ENCOUNTER — Telehealth: Payer: Self-pay | Admitting: *Deleted

## 2020-02-09 NOTE — Telephone Encounter (Signed)
-----   Message from Quintella Reichert, MD sent at 02/09/2020 11:09 AM EST ----- Nondiagnostic sleep study due to no sleep at sleep lab.  Please let patient know that we  can order an itamar home sleep study or repeat in lab study with a sleep aide

## 2020-02-09 NOTE — Procedures (Signed)
    Patient Name: Brandon Huber, Brandon Huber Date: 02/08/2020 Gender: Male D.O.B: 1991-06-02 Age (years): 28 Referring Provider: Bevelyn Buckles Bensimhon Height (inches): 72 Interpreting Physician: Armanda Magic MD, ABSM Weight (lbs): 265 RPSGT: Ulyess Mort BMI: 36 MRN: [id number] Neck Size: 15.50  CLINICAL INFORMATION Sleep Study Type: NPSG  Indication for sleep study: Fatigue, Hypertension, Obesity  Epworth Sleepiness Score: 1  SLEEP STUDY TECHNIQUE As per the AASM Manual for the Scoring of Sleep and Associated Events v2.3 (April 2016) with a hypopnea requiring 4% desaturations.  The channels recorded and monitored were frontal, central and occipital EEG, electrooculogram (EOG), submentalis EMG (chin), nasal and oral airflow, thoracic and abdominal wall motion, anterior tibialis EMG, snore microphone, electrocardiogram, and pulse oximetry.  MEDICATIONS Medications self-administered by patient taken the night of the study : N/A  SLEEP ARCHITECTURE The study was initiated at 10:34:00 PM and ended at 4:44:39 AM.  Sleep onset time was N/A minutes and the sleep efficiency was 0.0%. The total sleep time was 0 minutes.  Stage REM latency was N/A minutes.  The patient spent N/A of the night in stage N1 sleep, N/A in stage N2 sleep, N/A in stage N3 and 0% in REM.  Alpha intrusion was absent.  Supine sleep was N/A.  RESPIRATORY PARAMETERS The overall apnea/hypopnea index (AHI) was N/A per hour. There were N/A total apneas, including N/A obstructive, N/A central and N/A mixed apneas. There were N/A hypopneas and N/A RERAs.  The AHI during Stage REM sleep was N/A per hour.  AHI while supine was N/A per hour.  The mean oxygen saturation was N/A. The minimum SpO2 during sleep was N/A.  snoring was noted during this study.  CARDIAC DATA The 2 lead EKG demonstrated sinus rhythm. The mean heart rate was N/A beats per minute. Other EKG findings include: None.  LEG MOVEMENT  DATA The total PLMS were N/A with a resulting PLMS index of N/A. Associated arousal with leg movement index was N/A .  IMPRESSIONS - No snoring was audible during this study. - No cardiac abnormalities were noted during this study.  DIAGNOSIS - Nondiagnostic study due to no sleep  RECOMMENDATIONS - Recommend either Itamar home sleep study or repeat PSG in lab with sleep aide. - Avoid alcohol, sedatives and other CNS depressants that may worsen sleep apnea and disrupt normal sleep architecture. - Sleep hygiene should be reviewed to assess factors that may improve sleep quality. - Weight management and regular exercise should be initiated or continued if appropriate.  [Electronically signed] 02/09/2020 11:07 AM  Armanda Magic MD, ABSM Diplomate, American Board of Sleep Medicine

## 2020-02-09 NOTE — Telephone Encounter (Signed)
Informed patient of sleep study results and patient understanding was verbalized. Patient understands his sleep study showed to be Nondiagnostic sleep study due to no sleep at sleep lab. Pt is aware and agreeable to his results.  Patient understands that we can order an itamar home sleep study or repeat in lab study with a sleep aide.  Patient has chosen to try the itamar home sleep test.

## 2020-03-14 ENCOUNTER — Ambulatory Visit (HOSPITAL_COMMUNITY)
Admission: RE | Admit: 2020-03-14 | Discharge: 2020-03-14 | Disposition: A | Discharge status: Home / Self Care | Payer: 59 | Source: Ambulatory Visit | Attending: Internal Medicine | Admitting: Internal Medicine

## 2020-03-14 ENCOUNTER — Encounter (HOSPITAL_COMMUNITY): Payer: Self-pay | Admitting: Internal Medicine

## 2020-03-14 ENCOUNTER — Other Ambulatory Visit: Payer: Self-pay

## 2020-03-14 VITALS — BP 130/92 | HR 113 | Wt 277.6 lb

## 2020-03-14 DIAGNOSIS — I5023 Acute on chronic systolic (congestive) heart failure: Secondary | ICD-10-CM | POA: Diagnosis not present

## 2020-03-14 DIAGNOSIS — I110 Hypertensive heart disease with heart failure: Secondary | ICD-10-CM | POA: Diagnosis not present

## 2020-03-14 DIAGNOSIS — E669 Obesity, unspecified: Secondary | ICD-10-CM | POA: Insufficient documentation

## 2020-03-14 DIAGNOSIS — I5082 Biventricular heart failure: Secondary | ICD-10-CM | POA: Diagnosis not present

## 2020-03-14 DIAGNOSIS — R0683 Snoring: Secondary | ICD-10-CM | POA: Diagnosis not present

## 2020-03-14 DIAGNOSIS — Z7901 Long term (current) use of anticoagulants: Secondary | ICD-10-CM | POA: Diagnosis not present

## 2020-03-14 DIAGNOSIS — I428 Other cardiomyopathies: Secondary | ICD-10-CM | POA: Diagnosis not present

## 2020-03-14 DIAGNOSIS — I5022 Chronic systolic (congestive) heart failure: Secondary | ICD-10-CM

## 2020-03-14 DIAGNOSIS — Z7984 Long term (current) use of oral hypoglycemic drugs: Secondary | ICD-10-CM | POA: Diagnosis not present

## 2020-03-14 DIAGNOSIS — R000 Tachycardia, unspecified: Secondary | ICD-10-CM | POA: Diagnosis not present

## 2020-03-14 DIAGNOSIS — I10 Essential (primary) hypertension: Secondary | ICD-10-CM | POA: Diagnosis not present

## 2020-03-14 DIAGNOSIS — Z79899 Other long term (current) drug therapy: Secondary | ICD-10-CM | POA: Diagnosis not present

## 2020-03-14 DIAGNOSIS — Z9114 Patient's other noncompliance with medication regimen: Secondary | ICD-10-CM | POA: Diagnosis not present

## 2020-03-14 LAB — BASIC METABOLIC PANEL
Anion gap: 15 (ref 5–15)
BUN: 6 mg/dL (ref 6–20)
CO2: 30 mmol/L (ref 22–32)
Calcium: 8.2 mg/dL — ABNORMAL LOW (ref 8.9–10.3)
Chloride: 94 mmol/L — ABNORMAL LOW (ref 98–111)
Creatinine, Ser: 1.1 mg/dL (ref 0.61–1.24)
GFR calc Af Amer: >60 mL/min (ref >60)
GFR calc non Af Amer: >60 mL/min (ref >60)
Glucose, Bld: 106 mg/dL — ABNORMAL HIGH (ref 70–99)
Potassium: 2.9 mmol/L — ABNORMAL LOW (ref 3.5–5.1)
Sodium: 139 mmol/L (ref 135–145)

## 2020-03-14 LAB — CBC
HCT: 44.6 % (ref 39.0–52.0)
Hemoglobin: 15.2 g/dL (ref 13.0–17.0)
MCH: 31.7 pg (ref 26.0–34.0)
MCHC: 34.1 g/dL (ref 30.0–36.0)
MCV: 93.1 fL (ref 80.0–100.0)
Platelets: 237 10*3/uL (ref 150–400)
RBC: 4.79 MIL/uL (ref 4.22–5.81)
RDW: 12.8 % (ref 11.5–15.5)
WBC: 9.9 10*3/uL (ref 4.0–10.5)
nRBC: 0 % (ref 0.0–0.2)

## 2020-03-14 LAB — TSH: TSH: 4.578 u[IU]/mL — ABNORMAL HIGH (ref 0.350–4.500)

## 2020-03-14 MED ORDER — CARVEDILOL 6.25 MG PO TABS: ORAL_TABLET | ORAL | 3 refills | Status: DC

## 2020-03-14 NOTE — Patient Instructions (Addendum)
INCREASE Carvedilol (coreg) to 9.375mg  (1.5 tabs) twice a day for 2 weeks.  IF this dose is tolerated THEN INCREASE TO 12.5mg  (2 tabs) twice a day after that   Labs today We will only contact you if something comes back abnormal or we need to make some changes. Otherwise no news is good news!   Your physician recommends that you schedule a follow-up appointment in: 2 months with Dr Gala Romney   Please call office at [phone] option 2 if you have any questions or concerns.    At the [facility], you and your health needs are our priority. As part of our continuing mission to provide you with exceptional heart care, we have created designated Provider Care Teams. These Care Teams include your primary Cardiologist (physician) and Advanced Practice Providers (APPs- Physician Assistants and Nurse Practitioners) who all work together to provide you with the care you need, when you need it.   You may see any of the following providers on your designated Care Team at your next follow up: Marland Kitchen Dr Arvilla Meres . Dr Marca Ancona . Tonye Becket, NP . Robbie Lis, PA . Karle Plumber, PharmD   Please be sure to bring in all your medications bottles to every appointment.

## 2020-03-14 NOTE — Progress Notes (Signed)
[facility] Note   Referring Physician: PCP: Default, Provider, MD PCP-Cardiologist: Arvilla Meres, MD   HPI:  Mr Stankowski is a 29 year old with a history of obesity,hypertension (dx years ago but has not been treated) and tobacco abuse. He presented to [facility] on 07/22/19 with SOB. Echocardiogram  EF < 25%, severely dilated LV 1-2+ MR. Left heart catheterization revealed no significant CAD. RHC showed a mean wedge of 20, Fick CO/CI 4.02/1.7. He was diuresed approximately 20 pounds. He underwent cardiac MRI that showed no signs of infiltration but severely dilated LV and RV. He was placed on carvedilol, Entresto and Lasix at discharge. He was also given a LifeVest and referred to the [facility] for further management.   He was seen for the first time in clinic by Dr. Gala Romney on 09/28/19. He presented w/ NYHA IIIB-IV symptoms and marked volume overload in the setting of medication non compliance. He was directed admitted to hospital from clinic. Echo repeated at [facility] showed EF <20%. He was placed on IV lasix. He had good diuresis and did not require inotrope support. He diuresed 58 pounds. He was transitioned to torsemide 40 mg daily.  HF medications optimized. Discharge wt was 101 kg. [facility].   He presents back to clinic for f/u. Says he is doing fairly well. Denies any SOB, orthopnea or PND. Resting HR still 90-low 100s. Trying to be more active. Weight coming back up. Edema now well controlled. Had sleep study which was non-diagnostic due to no sleep. Will need in-lab studay.    2D Echo 09/28/19    1. Left ventricular ejection fraction, by visual estimation, is <20%. The left ventricle has severely decreased function. Moderately increased left ventricular size. Left ventricular septal wall thickness was normal. There is no left ventricular  hypertrophy.  2. Left ventricular diastolic Doppler parameters are consistent with  pseudonormalization pattern of LV diastolic filling.  3. Diffuse hypokinesis EF 15%.  4. Global right ventricle has mildly reduced systolic function.The right ventricular size is mildly enlarged. No increase in right ventricular wall thickness.  5. Left atrial size was moderately dilated.  6. Right atrial size was mildly dilated.  7. The mitral valve is normal in structure. Mild mitral valve regurgitation.  8. The tricuspid valve is normal in structure. Tricuspid valve regurgitation moderate.  9. The aortic valve is tricuspid Aortic valve regurgitation was not visualized by color flow Doppler. Mild aortic valve sclerosis without stenosis. 10. The pulmonic valve was grossly normal. Pulmonic valve regurgitation is mild by color flow Doppler. 11. Normal pulmonary artery systolic pressure.    Current Outpatient Medications  Medication Sig Dispense Refill  . carvedilol (COREG) 6.25 MG tablet Take 1 tablet (6.25 mg total) by mouth 2 (two) times daily. 180 tablet 3  . dapagliflozin propanediol (FARXIGA) 10 MG TABS tablet Take 10 mg by mouth daily before breakfast. 30 tablet 11  . digoxin (LANOXIN) 0.125 MG tablet Take 1 tablet (0.125 mg total) by mouth daily. 30 tablet 6  . potassium chloride SA (KLOR-CON) 20 MEQ tablet Take 20 mEq by mouth daily.    . sacubitril-valsartan (ENTRESTO) 97-103 MG Take 1 tablet by mouth 2 (two) times daily. 60 tablet 11  . spironolactone (ALDACTONE) 25 MG tablet Take 25 mg by mouth daily.    Marland Kitchen torsemide (DEMADEX) 20 MG tablet Take 40 mg by mouth daily.     No current facility-administered medications for this encounter.  No Known Allergies    Social History   Socioeconomic History  . Marital status: Unknown    Spouse name: Not on file  . Number of children: Not on file  . Years of education: Not on file  . Highest education level: Not on file  Occupational History  . Not on file  Tobacco Use  . Smoking status: Never Smoker  . Smokeless tobacco: Never  Used  Substance and Sexual Activity  . Alcohol use: Never  . Drug use: Not on file  . Sexual activity: Not on file  Other Topics Concern  . Not on file  Social History Narrative  . Not on file   Social Determinants of Health   Financial Resource Strain: Low Risk   . Difficulty of Paying Living Expenses: Not very hard  Food Insecurity: No Food Insecurity  . Worried About Charity fundraiser in the Last Year: Never true  . Ran Out of Food in the Last Year: Never true  Transportation Needs: No Transportation Needs  . Lack of Transportation (Medical): No  . Lack of Transportation (Non-Medical): No  Physical Activity: Insufficiently Active  . Days of Exercise per Week: 5 days  . Minutes of Exercise per Session: 20 min  Stress: No Stress Concern Present  . Feeling of Stress : Not at all  Social Connections:   . Frequency of Communication with Friends and Family:   . Frequency of Social Gatherings with Friends and Family:   . Attends Religious Services:   . Active Member of Clubs or Organizations:   . Attends Archivist Meetings:   Marland Kitchen Marital Status:   Intimate Partner Violence:   . Fear of Current or Ex-Partner:   . Emotionally Abused:   Marland Kitchen Physically Abused:   . Sexually Abused:     Vitals:   03/14/20 1001  BP: (!) 130/92  Pulse: (!) 113  SpO2: 98%  Weight: 125.9 kg (277 lb 9.6 oz)     PHYSICAL EXAM: General:  Well appearing. No resp difficulty HEENT: normal Neck: supple. no JVD. Carotids 2+ bilat; no bruits. No lymphadenopathy or thryomegaly appreciated. Cor: PMI nondisplaced. Regular tachy. No rubs, gallops or murmurs. Lungs: clear Abdomen: obese soft, nontender, nondistended. No hepatosplenomegaly. No bruits or masses. Good bowel sounds. Extremities: no cyanosis, clubbing, rash, edema Neuro: alert & orientedx3, cranial nerves grossly intact. moves all 4 extremities w/o difficulty. Affect pleasant    ASSESSMENT & PLAN:  1. 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 Echo w/ severely reduced LVEF at 20% w/ biventricular failure. LHC 06/2019 showed normal coronaries. RHC showed mean wedge of 20, FICK CO/CI 4.02/1.7. cMRI showed no signs of infiltration but severely dilated LV and RV.  Repeat echo at [facility] 09/29/19 showed EF at 15% no LVH, has mildly reduced RV systolic dysfunction w/ normal PA systolic pressure. Mild MR.  - ? Etiology. ?OSA, hypertensive heart disease vs familial (father w/ CHF) - Stable NYHA II- early III though not very active - Echo 01/04/20  EF 20-25% slightly improved from previous (coimpared side-to-side and showed him and his mom) - Volume status looks good - Improving but still tachycardic -Continue torsemide 40 mg daily - ContinueEntresto 97/103  bid. - Continue spironolactone 25 mg daily.  - Continue Digoxin 0.125 - Increase carvedilol to 9.375 and if tolerates go to 12.5 in 2 weeks - Consider ivabradine if HR still eelvated - Continue Farxiga 10 daily - At last visit we had  long discussion about pros/cons of proceeding with ICD or waiting another few months to see if EF will continue to recover. Has LifeVest but wearing only intermittently. We have decided to waiy - Blood type AB+. Would be a good transplant candidate if HF dose not improve w/ medical management.  - Labs today  2. HTN: - Blood pressure still elevated - increasing carvedilol  3. Obesity: - Had sleep study in 2/21 with Dr. Mayford Knife  which was non-diagnostic due to no sleep. Will need in-lab studay.  - will schedule in lab sleep study. Suspect he has significant OSA  4. Tobacco Abuse:  - remains quit   Arvilla Meres, MD 03/14/20

## 2020-03-15 ENCOUNTER — Telehealth (HOSPITAL_COMMUNITY): Payer: Self-pay

## 2020-03-15 MED ORDER — POTASSIUM CHLORIDE CRYS ER 20 MEQ PO TBCR: 40.0000 meq | EXTENDED_RELEASE_TABLET | Freq: Two times a day (BID) | ORAL | 4 refills | Status: DC

## 2020-03-15 NOTE — Telephone Encounter (Signed)
Unable to reach patient this morning.  Called and spoke with mother.  She said she called Brandon Huber last night and told him instructions. Because it was late she does not know if he took second dose.  I advised if he did not, he needs to take second dose this morning THEN 2 tabs this afternoon.  Advised he needs to take 40 meq BID going forward. Verbalized understanding.  Appt made for Monday. Advised to call if they have any questions.

## 2020-03-15 NOTE — Telephone Encounter (Signed)
-----   Message from Dolores Patty, MD sent at 03/14/2020  8:42 PM EDT ----- K 2.9.   I called him but no answer and VM not set up  I left a message on his mother's VM  Take 60 meq potassium now and repeat in 2 hours.  Then starting tomorrow take 40 bid.   Please call and review with them in am. Recheck BMET on Monday.

## 2020-03-19 ENCOUNTER — Ambulatory Visit (HOSPITAL_COMMUNITY)
Admission: RE | Admit: 2020-03-19 | Discharge: 2020-03-19 | Disposition: A | Discharge status: Home / Self Care | Payer: 59 | Source: Ambulatory Visit | Attending: Cardiology | Admitting: Cardiology

## 2020-03-19 ENCOUNTER — Other Ambulatory Visit: Payer: Self-pay

## 2020-03-19 DIAGNOSIS — I5022 Chronic systolic (congestive) heart failure: Secondary | ICD-10-CM | POA: Insufficient documentation

## 2020-03-19 LAB — BASIC METABOLIC PANEL
Anion gap: 12 (ref 5–15)
BUN: 9 mg/dL (ref 6–20)
CO2: 29 mmol/L (ref 22–32)
Calcium: 8.5 mg/dL — ABNORMAL LOW (ref 8.9–10.3)
Chloride: 96 mmol/L — ABNORMAL LOW (ref 98–111)
Creatinine, Ser: 1.08 mg/dL (ref 0.61–1.24)
GFR calc Af Amer: >60 mL/min (ref >60)
GFR calc non Af Amer: >60 mL/min (ref >60)
Glucose, Bld: 118 mg/dL — ABNORMAL HIGH (ref 70–99)
Potassium: 3.1 mmol/L — ABNORMAL LOW (ref 3.5–5.1)
Sodium: 137 mmol/L (ref 135–145)

## 2020-03-22 ENCOUNTER — Telehealth (HOSPITAL_COMMUNITY): Payer: Self-pay | Admitting: *Deleted

## 2020-03-22 DIAGNOSIS — I5022 Chronic systolic (congestive) heart failure: Secondary | ICD-10-CM

## 2020-03-22 MED ORDER — POTASSIUM CHLORIDE CRYS ER 20 MEQ PO TBCR: 60.0000 meq | EXTENDED_RELEASE_TABLET | Freq: Two times a day (BID) | ORAL | 4 refills | Status: DC

## 2020-03-22 NOTE — Telephone Encounter (Signed)
Unable to reach pt, spoke w/his mother, confirmed dose, pt is taking KCL 40 BID as ordered last week, she will have him take 80 BID today then increase to 60 BID, repeat labs sch for 3/31

## 2020-03-22 NOTE — Telephone Encounter (Signed)
-----   Message from Dolores Patty, MD sent at 03/21/2020  9:50 PM EDT ----- Increase potassium to 60 daily. Have him take 80 the first day. Recheck 1 week,

## 2020-03-28 ENCOUNTER — Other Ambulatory Visit: Payer: Self-pay

## 2020-03-28 ENCOUNTER — Ambulatory Visit (HOSPITAL_COMMUNITY)
Admission: RE | Admit: 2020-03-28 | Discharge: 2020-03-28 | Disposition: A | Discharge status: Home / Self Care | Payer: 59 | Source: Ambulatory Visit | Attending: Internal Medicine | Admitting: Internal Medicine

## 2020-03-28 DIAGNOSIS — I5022 Chronic systolic (congestive) heart failure: Secondary | ICD-10-CM | POA: Diagnosis not present

## 2020-03-28 LAB — BASIC METABOLIC PANEL
Anion gap: 15 (ref 5–15)
BUN: 9 mg/dL (ref 6–20)
CO2: 30 mmol/L (ref 22–32)
Calcium: 9 mg/dL (ref 8.9–10.3)
Chloride: 89 mmol/L — ABNORMAL LOW (ref 98–111)
Creatinine, Ser: 1.05 mg/dL (ref 0.61–1.24)
GFR calc Af Amer: >60 mL/min (ref >60)
GFR calc non Af Amer: >60 mL/min (ref >60)
Glucose, Bld: 115 mg/dL — ABNORMAL HIGH (ref 70–99)
Potassium: 2.8 mmol/L — ABNORMAL LOW (ref 3.5–5.1)
Sodium: 134 mmol/L — ABNORMAL LOW (ref 135–145)

## 2020-04-04 ENCOUNTER — Other Ambulatory Visit (HOSPITAL_COMMUNITY): Payer: Self-pay | Admitting: Internal Medicine

## 2020-04-05 ENCOUNTER — Other Ambulatory Visit: Payer: Self-pay

## 2020-04-05 ENCOUNTER — Other Ambulatory Visit (HOSPITAL_COMMUNITY): Payer: Self-pay | Admitting: *Deleted

## 2020-04-05 ENCOUNTER — Ambulatory Visit (HOSPITAL_COMMUNITY)
Admission: RE | Admit: 2020-04-05 | Discharge: 2020-04-05 | Disposition: A | Discharge status: Home / Self Care | Payer: 59 | Source: Ambulatory Visit | Attending: Cardiology | Admitting: Cardiology

## 2020-04-05 DIAGNOSIS — I5022 Chronic systolic (congestive) heart failure: Secondary | ICD-10-CM | POA: Insufficient documentation

## 2020-04-05 LAB — BASIC METABOLIC PANEL
Anion gap: 14 (ref 5–15)
BUN: 8 mg/dL (ref 6–20)
CO2: 27 mmol/L (ref 22–32)
Calcium: 8.8 mg/dL — ABNORMAL LOW (ref 8.9–10.3)
Chloride: 98 mmol/L (ref 98–111)
Creatinine, Ser: 0.98 mg/dL (ref 0.61–1.24)
GFR calc Af Amer: >60 mL/min (ref >60)
GFR calc non Af Amer: >60 mL/min (ref >60)
Glucose, Bld: 104 mg/dL — ABNORMAL HIGH (ref 70–99)
Potassium: 3.7 mmol/L (ref 3.5–5.1)
Sodium: 139 mmol/L (ref 135–145)

## 2020-04-06 ENCOUNTER — Other Ambulatory Visit (HOSPITAL_COMMUNITY): Payer: Self-pay | Admitting: Adult Health

## 2020-05-14 ENCOUNTER — Other Ambulatory Visit: Payer: Self-pay

## 2020-05-14 ENCOUNTER — Encounter (HOSPITAL_COMMUNITY): Payer: Self-pay | Admitting: Internal Medicine

## 2020-05-14 ENCOUNTER — Ambulatory Visit (HOSPITAL_COMMUNITY)
Admission: RE | Admit: 2020-05-14 | Discharge: 2020-05-14 | Disposition: A | Discharge status: Home / Self Care | Payer: 59 | Source: Ambulatory Visit | Attending: Internal Medicine | Admitting: Internal Medicine

## 2020-05-14 VITALS — BP 124/89 | HR 118 | Wt 279.6 lb

## 2020-05-14 DIAGNOSIS — I110 Hypertensive heart disease with heart failure: Secondary | ICD-10-CM | POA: Diagnosis not present

## 2020-05-14 DIAGNOSIS — Z87891 Personal history of nicotine dependence: Secondary | ICD-10-CM | POA: Diagnosis not present

## 2020-05-14 DIAGNOSIS — I5082 Biventricular heart failure: Secondary | ICD-10-CM | POA: Insufficient documentation

## 2020-05-14 DIAGNOSIS — I5022 Chronic systolic (congestive) heart failure: Secondary | ICD-10-CM | POA: Insufficient documentation

## 2020-05-14 DIAGNOSIS — I428 Other cardiomyopathies: Secondary | ICD-10-CM | POA: Insufficient documentation

## 2020-05-14 DIAGNOSIS — I10 Essential (primary) hypertension: Secondary | ICD-10-CM

## 2020-05-14 DIAGNOSIS — E669 Obesity, unspecified: Secondary | ICD-10-CM | POA: Diagnosis not present

## 2020-05-14 DIAGNOSIS — Z79899 Other long term (current) drug therapy: Secondary | ICD-10-CM | POA: Insufficient documentation

## 2020-05-14 DIAGNOSIS — Z9114 Patient's other noncompliance with medication regimen: Secondary | ICD-10-CM | POA: Diagnosis not present

## 2020-05-14 LAB — BASIC METABOLIC PANEL
Anion gap: 18 — ABNORMAL HIGH (ref 5–15)
BUN: <5 mg/dL — ABNORMAL LOW (ref 6–20)
CO2: 28 mmol/L (ref 22–32)
Calcium: 8.9 mg/dL (ref 8.9–10.3)
Chloride: 91 mmol/L — ABNORMAL LOW (ref 98–111)
Creatinine, Ser: 0.97 mg/dL (ref 0.61–1.24)
GFR calc Af Amer: >60 mL/min (ref >60)
GFR calc non Af Amer: >60 mL/min (ref >60)
Glucose, Bld: 112 mg/dL — ABNORMAL HIGH (ref 70–99)
Potassium: 2.4 mmol/L — CL (ref 3.5–5.1)
Sodium: 137 mmol/L (ref 135–145)

## 2020-05-14 LAB — DIGOXIN LEVEL: Digoxin Level: <0.2 ng/mL — ABNORMAL LOW (ref 0.8–2.0)

## 2020-05-14 LAB — BRAIN NATRIURETIC PEPTIDE: B Natriuretic Peptide: 634.4 pg/mL — ABNORMAL HIGH (ref 0.0–100.0)

## 2020-05-14 MED ORDER — CARVEDILOL 12.5 MG PO TABS: 12.5000 mg | ORAL_TABLET | Freq: Two times a day (BID) | ORAL | 6 refills | Status: DC

## 2020-05-14 MED ORDER — IVABRADINE HCL 5 MG PO TABS: 5.0000 mg | ORAL_TABLET | Freq: Two times a day (BID) | ORAL | 6 refills | Status: DC

## 2020-05-14 NOTE — Patient Instructions (Signed)
Increase Carvedilol to 12.5 mg Twice daily   Start Ivabradine 5 mg Twice daily   Your physician has requested that you have a cardiac MRI. Cardiac MRI uses a computer to create images of your heart as its beating, producing both still and moving pictures of your heart and major blood vessels. For further information please visit [email or web address]. Please follow the instruction sheet given to you today for more information.  IN 2 MONTHS  Your physician recommends that you schedule a follow-up appointment in: 2 months  If you have any questions or concerns before your next appointment please send [place] a message through [place] or call our office at [phone].  At the [facility], you and your health needs are our priority. As part of our continuing mission to provide you with exceptional heart care, we have created designated Provider Care Teams. These Care Teams include your primary Cardiologist (physician) and Advanced Practice Providers (APPs- Physician Assistants and Nurse Practitioners) who all work together to provide you with the care you need, when you need it.   You may see any of the following providers on your designated Care Team at your next follow up: Marland Kitchen Dr Arvilla Meres . Dr Marca Ancona . Tonye Becket, NP . Robbie Lis, PA . Karle Plumber, PharmD   Please be sure to bring in all your medications bottles to every appointment.

## 2020-05-14 NOTE — Progress Notes (Signed)
[facility] Note   Referring Physician: PCP: Default, Provider, MD PCP-Cardiologist: Glori Bickers, MD   HPI:  Brandon Huber is a 29 year old with a history of obesity,hypertension (dx years ago but has not been treated) and tobacco abuse. He presented to [facility] on 07/22/19 with SOB. Echocardiogram  EF < 25%, severely dilated LV 1-2+ Brandon. Left heart catheterization revealed no significant CAD. RHC showed a mean wedge of 20, Fick CO/CI 4.02/1.7. He was diuresed approximately 20 pounds. He underwent cardiac MRI that showed no signs of infiltration but severely dilated LV and RV. He was placed on carvedilol, Entresto and Lasix at discharge. He was also given a LifeVest and referred to the [facility] for further management.   He was seen for the first time in clinic by Dr. Haroldine Laws on 09/28/19. He presented w/ NYHA IIIB-IV symptoms and marked volume overload in the setting of medication non compliance. He was directed admitted to hospital from clinic. Echo repeated at [facility] showed EF <20%. He was placed on IV lasix. He had good diuresis and did not require inotrope support. He diuresed 58 pounds. He was transitioned to torsemide 40 mg daily.  HF medications optimized. Discharge wt was 101 kg. He has been following in [facility] for med titration.   He presents back to clinic for f/u. Here with his mom. At last visit carvedilol increased to 9.375 bid with instructions to go to 12.5 bid in 2 weeks if he tolerated. Has tolerated well but has not yet gone up to 12.5 bid. Says he feels a lot better recently. Can do all ADLs without any problem. Struggling with migraines - which is chronic. No edema, orthopnea or PND. Resting HR 100 on FitBit. Gets 6500 steps per day    Echo 01/04/20 EF 20-25% Rv ok   2D Echo 09/28/19  EF 15%. Mild RV dysfunction    Current Outpatient Medications  Medication Sig Dispense Refill  . carvedilol (COREG) 6.25 MG tablet Take 9.375 mg by mouth 2 (two) times  daily with a meal.    . dapagliflozin propanediol (FARXIGA) 10 MG TABS tablet Take 10 mg by mouth daily before breakfast. 30 tablet 11  . digoxin (LANOXIN) 0.125 MG tablet Take 1 tablet (0.125 mg total) by mouth daily. 30 tablet 6  . potassium chloride SA (KLOR-CON) 20 MEQ tablet Take 40 mEq by mouth 2 (two) times daily.    . sacubitril-valsartan (ENTRESTO) 97-103 MG Take 1 tablet by mouth daily.    Marland Kitchen spironolactone (ALDACTONE) 25 MG tablet Take 25 mg by mouth daily.    Marland Kitchen torsemide (DEMADEX) 20 MG tablet TAKE 2 TABLETS (40 MG) BY MOUTH DAILY 180 tablet 3   No current facility-administered medications for this encounter.    No Known Allergies    Social History   Socioeconomic History  . Marital status: Unknown    Spouse name: Not on file  . Number of children: Not on file  . Years of education: Not on file  . Highest education level: Not on file  Occupational History  . Not on file  Tobacco Use  . Smoking status: Never Smoker  . Smokeless tobacco: Never Used  Substance and Sexual Activity  . Alcohol use: Never  . Drug use: Not on file  . Sexual activity: Not on file  Other Topics Concern  . Not on file  Social History Narrative  . Not on file   Social Determinants of Health   Financial Resource Strain: Low Risk   .  Difficulty of Paying Living Expenses: Not very hard  Food Insecurity: No Food Insecurity  . Worried About Programme researcher, broadcasting/film/video in the Last Year: Never true  . Ran Out of Food in the Last Year: Never true  Transportation Needs: No Transportation Needs  . Lack of Transportation (Medical): No  . Lack of Transportation (Non-Medical): No  Physical Activity: Insufficiently Active  . Days of Exercise per Week: 5 days  . Minutes of Exercise per Session: 20 min  Stress: No Stress Concern Present  . Feeling of Stress : Not at all  Social Connections:   . Frequency of Communication with Friends and Family:   . Frequency of Social Gatherings with Friends and Family:    . Attends Religious Services:   . Active Member of Clubs or Organizations:   . Attends Banker Meetings:   Marland Kitchen Marital Status:   Intimate Partner Violence:   . Fear of Current or Ex-Partner:   . Emotionally Abused:   Marland Kitchen Physically Abused:   . Sexually Abused:     Vitals:   05/14/20 1216  BP: 124/89  Pulse: (!) 118  SpO2: 97%  Weight: 126.8 kg (279 lb 9.6 oz)     PHYSICAL EXAM: General:  Well appearing. No resp difficulty HEENT: normal Neck: supple. no JVD. Carotids 2+ bilat; no bruits. No lymphadenopathy or thryomegaly appreciated. Cor: PMI nondisplaced. Tachy Regular. No rubs, gallops or murmurs. Lungs: clear Abdomen: obese soft, nontender, nondistended. No hepatosplenomegaly. No bruits or masses. Good bowel sounds. Extremities: no cyanosis, clubbing, rash, edema Neuro: alert & orientedx3, cranial nerves grossly intact. moves all 4 extremities w/o difficulty. Affect pleasant  ECG: STach 124 + LVH Personally reviewed   ASSESSMENT & PLAN:  1. Chronic systolic HF:  Diagnosed with NICM 06/2019 at [facility]. Echo w/ severely reduced LVEF at 20% w/ biventricular failure. LHC 06/2019 showed normal coronaries. RHC showed mean wedge of 20, FICK CO/CI 4.02/1.7. cMRI showed no signs of infiltration but severely dilated LV and RV.  Repeat echo at [facility] 09/29/19 showed EF at 15% no LVH, has mildly reduced RV systolic dysfunction w/ normal PA systolic pressure. Mild Brandon.  - ? Etiology. ?OSA, hypertensive heart disease vs familial (father w/ CHF) - Stable NYHA II- early III though still tachy - Echo 01/04/20  EF 20-25% - Volume status looks good - Improving but still tachycardic -Continue torsemide 40 mg daily - ContinueEntresto 97/103  Bid (has only been taking once per day) - Continue spironolactone 25 mg daily.  - Continue Digoxin 0.125 - Increase carvedilol to 12.5  -  Continues with prominent tachycardia. Add ivabradine 5 bid - Continue Farxiga 10 daily - At  last visit we had long discussion about pros/cons of proceeding with ICD or waiting another few months to see if EF will continue to recover. Has LifeVest but wearing only intermittently. We have decided to wait - Blood type AB+. Would be a good transplant candidate if HF dose not improve w/ medical management.  - Blood work today - cMRI in 2 months  2. HTN: - Blood pressure well controlled. Continue current regimen.  3. Obesity: - Had sleep study in 2/21 with Dr. Mayford Knife  which was non-diagnostic due to no sleep. Will need in-lab studay.  - will schedule in lab sleep study. Suspect he has significant OSA  4. Tobacco Abuse:  - remains quit. No change   Arvilla Meres, MD 05/14/20

## 2020-05-14 NOTE — Addendum Note (Signed)
Encounter addended by: Noralee Space, RN on: 05/14/2020 12:55 PM  Actions taken: Pharmacy for encounter modified, Order list changed, Diagnosis association updated, Clinical Note Signed

## 2020-05-14 NOTE — Addendum Note (Signed)
Encounter addended by: Noralee Space, RN on: 05/14/2020 1:12 PM  Actions taken: Order list changed, Diagnosis association updated

## 2020-05-15 ENCOUNTER — Telehealth (HOSPITAL_COMMUNITY): Payer: Self-pay | Admitting: *Deleted

## 2020-05-15 DIAGNOSIS — E876 Hypokalemia: Secondary | ICD-10-CM

## 2020-05-15 NOTE — Telephone Encounter (Signed)
LATE ENTRY from 5/17 PM: K 2.4 per Dr Gala Romney need to ensure pt is actually taking KCL and how much exactly he is taking, have him take 160 meq today, increase Entresto to BID, if pt is not taking KCL have him take 40 mq BID, if he is taking that consistently increase to 80 meq BID.  Attempted to call pt numerous time with no answer left detailed VM to take extra KCL and call us in AM.  Spoke with pt this morning.He states he misses several days a week of KCL and has just been trying to increase K in his diet. Educated pt on hypo and hyperkalemia and importance of taking medication. He is agreeable and verbalizes understanding. He did not take extra KCL yesterday only his 40 BID. He will take 160 meq today spread out over the day, will increase Entresto to BID and will take KCL 40 meq BID everyday. Will repeat lab on Fri 5/21.

## 2020-05-18 ENCOUNTER — Other Ambulatory Visit (HOSPITAL_COMMUNITY): Payer: PRIVATE HEALTH INSURANCE

## 2020-07-05 ENCOUNTER — Telehealth (HOSPITAL_COMMUNITY): Payer: Self-pay | Admitting: *Deleted

## 2020-07-05 NOTE — Telephone Encounter (Signed)
Attempted to call patient regarding upcoming cardiac MRI appointment. Pt's voicemail was not set up to leave messages.  Will attempt to reach patient again.  Burley Saver RN Navigator [facility] [phone] Office [phone] Cell

## 2020-07-05 NOTE — Telephone Encounter (Signed)
Second attempt to call patient regarding upcoming cardiac MRI appointment. Pt's voicemail was not set up to leave a message.  Burley Saver RN Navigator [facility] [phone] Office [phone] Cell

## 2020-07-06 ENCOUNTER — Ambulatory Visit (HOSPITAL_COMMUNITY): Admission: RE | Admit: 2020-07-06 | Payer: 59 | Source: Ambulatory Visit

## 2020-07-18 ENCOUNTER — Telehealth (HOSPITAL_COMMUNITY): Payer: Self-pay | Admitting: Vascular Surgery

## 2020-07-18 ENCOUNTER — Encounter (HOSPITAL_COMMUNITY): Payer: PRIVATE HEALTH INSURANCE | Admitting: Internal Medicine

## 2020-07-18 NOTE — Telephone Encounter (Signed)
Pt left message on scheduling line that he would not be about to make his 7/21 10:40 am appt , returned pt cal to reschedule pt did not answer and PT does not have VM set up

## 2020-09-07 ENCOUNTER — Encounter (HOSPITAL_COMMUNITY): Payer: 59 | Admitting: Cardiology

## 2020-10-03 NOTE — Progress Notes (Signed)
Advanced Heart Failure Clinic Note   Referring Physician: PCP: Default, Provider, MD PCP-Cardiologist: No primary care provider on file.   HPI:  [name] is a 29 year old with morbid obesity,severehypertension and tobacco abuse. He presented to [facility] on 7/20 with acute HF. Echocardiogram  EF < 25%, severely dilated LV 1-2+ MR. Left heart catheterization revealed no significant CAD. RHC with wedge of 20, Fick CO/CI 4.02/1.7. He was diuresed approximately 20 pounds. He underwent cardiac MRI that showed no signs of infiltration but severely dilated LV and RV. He was placed on carvedilol, Entresto and Lasix at discharge. He was also given a LifeVest and referred to the [facility] for further management.   He was seen for the first time in HF Clinic on 09/28/19. He presented w/ NYHA IIIB-IV symptoms and marked volume overload in the setting of medication non compliance. He was directed admitted to hospital from clinic. Echo EF <20%. He was placed on IV lasix. He had good diuresis and did not require inotrope support. He diuresed 58 pounds.Discharge wt was 101 kg.  He presents back to clinic for f/u. Here with his mom. Says he is doing ok. Not very active. Says he hurt his back and it is still sore. Says he takes his meds regularly. Edema well controlled. No SOB unless he is really walking fast or trying to jog. No dizziness. Says he is not always weighing himself regularly. Previously on torsemide 40 daily but was having ab bloating and orthopnea so he increased torsemide to 40/20. Weight up 11 pounds here. Wears a FitBit gets 9-10k. Says resting HR 80 but quickly goes up to 100-110   Echo 01/04/20 EF 20-25% RV ok   2D Echo 09/28/19  EF 15%. Mild RV dysfunction    Current Outpatient Medications  Medication Sig Dispense Refill  . carvedilol (COREG) 12.5 MG tablet Take 1 tablet (12.5 mg total) by mouth 2 (two) times daily with a meal. 60 tablet 6  . dapagliflozin propanediol (FARXIGA) 10 MG TABS  tablet Take 10 mg by mouth daily before breakfast. 30 tablet 11  . digoxin (LANOXIN) 0.125 MG tablet Take 1 tablet (0.125 mg total) by mouth daily. 30 tablet 6  . ivabradine (CORLANOR) 5 MG TABS tablet Take 1 tablet (5 mg total) by mouth 2 (two) times daily with a meal. 60 tablet 6  . potassium chloride SA (KLOR-CON) 20 MEQ tablet Take 40 mEq by mouth 2 (two) times daily.    . sacubitril-valsartan (ENTRESTO) 97-103 MG Take 1 tablet by mouth daily.    Marland Kitchen spironolactone (ALDACTONE) 25 MG tablet Take 25 mg by mouth daily.    Marland Kitchen torsemide (DEMADEX) 20 MG tablet TAKE 2 TABLETS (40 MG) BY MOUTH DAILY 180 tablet 3   No current facility-administered medications for this encounter.    No Known Allergies    Social History   Socioeconomic History  . Marital status: Unknown    Spouse name: Not on file  . Number of children: Not on file  . Years of education: Not on file  . Highest education level: Not on file  Occupational History  . Not on file  Tobacco Use  . Smoking status: Never Smoker  . Smokeless tobacco: Never Used  Substance and Sexual Activity  . Alcohol use: Never  . Drug use: Not on file  . Sexual activity: Not on file  Other Topics Concern  . Not on file  Social History Narrative  . Not on file   Social Determinants  of Health   Financial Resource Strain: Low Risk   . Difficulty of Paying Living Expenses: Not very hard  Food Insecurity: No Food Insecurity  . Worried About Programme researcher, broadcasting/film/video in the Last Year: Never true  . Ran Out of Food in the Last Year: Never true  Transportation Needs: No Transportation Needs  . Lack of Transportation (Medical): No  . Lack of Transportation (Non-Medical): No  Physical Activity: Insufficiently Active  . Days of Exercise per Week: 5 days  . Minutes of Exercise per Session: 20 min  Stress: No Stress Concern Present  . Feeling of Stress : Not at all  Social Connections:   . Frequency of Communication with Friends and Family: Not on  file  . Frequency of Social Gatherings with Friends and Family: Not on file  . Attends Religious Services: Not on file  . Active Member of Clubs or Organizations: Not on file  . Attends Banker Meetings: Not on file  . Marital Status: Not on file  Intimate Partner Violence:   . Fear of Current or Ex-Partner: Not on file  . Emotionally Abused: Not on file  . Physically Abused: Not on file  . Sexually Abused: Not on file    Vitals:   10/04/20 1329  BP: 120/86  Pulse: 62  SpO2: 98%  Weight: 131.9 kg (290 lb 12.8 oz)   Wt Readings from Last 3 Encounters:  10/04/20 131.9 kg (290 lb 12.8 oz)  05/14/20 126.8 kg (279 lb 9.6 oz)  03/14/20 125.9 kg (277 lb 9.6 oz)     PHYSICAL EXAM: General:  Well appearing. No resp difficulty HEENT: normal Neck: supple. no JVD. Carotids 2+ bilat; no bruits. No lymphadenopathy or thryomegaly appreciated. Cor: PMI nondisplaced. Tachy Regular. No rubs, gallops or murmurs. Lungs: clear Abdomen: obese soft, nontender, nondistended. No hepatosplenomegaly. No bruits or masses. Good bowel sounds. Extremities: no cyanosis, clubbing, rash, edema Neuro: alert & orientedx3, cranial nerves grossly intact. moves all 4 extremities w/o difficulty. Affect pleasant    ASSESSMENT & PLAN:  1. Chronic systolic HF:   - Diagnosed  06/2019 at [facility]. LVEF at 20% w/ biventricular failure. LHC 06/2019 showed normal coronaries. RHC showed mean wedge of 20, FICK CO/CI 4.02/1.7. cMRI showed no signs of infiltration but severely dilated LV and RV.  - Echo at [facility] 09/29/19 showed EF at 15% no LVH, has mildly reduced RV systolic dysfunction w/ normal PA systolic pressure. Mild MR.  - ? Etiology. ?OSA, hypertensive heart disease vs familial (father w/ CHF) - NYHA II  - Echo 01/04/20  EF 20-25% - Volume status looks good - Improving but still tachycardic -Continue torsemide 40/20  - ContinueEntresto 97/103  Bid  - Continue spironolactone 25 mg daily.    - Continue Digoxin 0.125 - Continue carvedilol to 12.5  -  Continues with prominent tachycardia. Add ivabradine 5 bid - Continue Farxiga 10 daily - At last visit we had long discussion about pros/cons of proceeding with ICD or waiting another few months to see if EF will continue to recover. Has LifeVest but wearing only intermittently. We have decided to wait - Blood type AB+. Would be a good transplant candidate if HF dose not improve w/ medical management.  - Blood work today   2. HTN: - Blood pressure well controlled. Continue current regimen.  3. Obesity: - Had sleep study in 2/21 with Dr. Mayford Knife  which was non-diagnostic due to no sleep. Will need in-lab studay.  -  will schedule in lab sleep study. Suspect he has significant OSA  4. Tobacco Abuse:  - remains quit. No change   Arvilla Meres, MD 10/04/20

## 2020-10-04 ENCOUNTER — Other Ambulatory Visit: Payer: Self-pay

## 2020-10-04 ENCOUNTER — Ambulatory Visit (HOSPITAL_COMMUNITY)
Admission: RE | Admit: 2020-10-04 | Discharge: 2020-10-04 | Disposition: A | Discharge status: Home / Self Care | Payer: 59 | Source: Ambulatory Visit | Attending: Internal Medicine | Admitting: Internal Medicine

## 2020-10-04 ENCOUNTER — Encounter (HOSPITAL_COMMUNITY): Payer: Self-pay | Admitting: Internal Medicine

## 2020-10-04 VITALS — BP 120/86 | HR 120 | Wt 290.8 lb

## 2020-10-04 DIAGNOSIS — I10 Essential (primary) hypertension: Secondary | ICD-10-CM | POA: Diagnosis not present

## 2020-10-04 DIAGNOSIS — I5023 Acute on chronic systolic (congestive) heart failure: Secondary | ICD-10-CM

## 2020-10-04 DIAGNOSIS — I5022 Chronic systolic (congestive) heart failure: Secondary | ICD-10-CM

## 2020-10-04 DIAGNOSIS — Z720 Tobacco use: Secondary | ICD-10-CM | POA: Diagnosis not present

## 2020-10-04 DIAGNOSIS — I110 Hypertensive heart disease with heart failure: Secondary | ICD-10-CM | POA: Diagnosis not present

## 2020-10-04 DIAGNOSIS — Z7984 Long term (current) use of oral hypoglycemic drugs: Secondary | ICD-10-CM | POA: Diagnosis not present

## 2020-10-04 DIAGNOSIS — R000 Tachycardia, unspecified: Secondary | ICD-10-CM

## 2020-10-04 DIAGNOSIS — Z79899 Other long term (current) drug therapy: Secondary | ICD-10-CM | POA: Diagnosis not present

## 2020-10-04 DIAGNOSIS — Z6839 Body mass index (BMI) 39.0-39.9, adult: Secondary | ICD-10-CM | POA: Insufficient documentation

## 2020-10-04 LAB — CBC
HCT: 45.7 % (ref 39.0–52.0)
Hemoglobin: 14.7 g/dL (ref 13.0–17.0)
MCH: 29.5 pg (ref 26.0–34.0)
MCHC: 32.2 g/dL (ref 30.0–36.0)
MCV: 91.6 fL (ref 80.0–100.0)
Platelets: 253 10*3/uL (ref 150–400)
RBC: 4.99 MIL/uL (ref 4.22–5.81)
RDW: 13.5 % (ref 11.5–15.5)
WBC: 8.8 10*3/uL (ref 4.0–10.5)
nRBC: 0 % (ref 0.0–0.2)

## 2020-10-04 LAB — T4, FREE: Free T4: 0.85 ng/dL (ref 0.61–1.12)

## 2020-10-04 LAB — BASIC METABOLIC PANEL
Anion gap: 12 (ref 5–15)
BUN: 9 mg/dL (ref 6–20)
CO2: 21 mmol/L — ABNORMAL LOW (ref 22–32)
Calcium: 9.2 mg/dL (ref 8.9–10.3)
Chloride: 101 mmol/L (ref 98–111)
Creatinine, Ser: 0.96 mg/dL (ref 0.61–1.24)
GFR calc non Af Amer: >60 mL/min (ref >60)
Glucose, Bld: 109 mg/dL — ABNORMAL HIGH (ref 70–99)
Potassium: 3.7 mmol/L (ref 3.5–5.1)
Sodium: 134 mmol/L — ABNORMAL LOW (ref 135–145)

## 2020-10-04 LAB — TSH: TSH: 1.178 u[IU]/mL (ref 0.350–4.500)

## 2020-10-04 LAB — DIGOXIN LEVEL: Digoxin Level: <0.2 ng/mL — ABNORMAL LOW (ref 1.0–2.0)

## 2020-10-04 LAB — BRAIN NATRIURETIC PEPTIDE: B Natriuretic Peptide: 1037.3 pg/mL — ABNORMAL HIGH (ref 0.0–100.0)

## 2020-10-04 MED ORDER — IVABRADINE HCL 7.5 MG PO TABS: 7.5000 mg | ORAL_TABLET | Freq: Two times a day (BID) | ORAL | 3 refills | Status: DC

## 2020-10-04 NOTE — Patient Instructions (Addendum)
INCREASE Corlanor 7.5mg  (1 tablet) twice daily  Labs done today, your results will be available in MyChart, we will contact you for abnormal readings.  Your provider has recommended that  you wear a Zio Patch for 14 days.  This monitor will record your heart rhythm for our review.  IF you have any symptoms while wearing the monitor please press the button.  If you have any issues with the patch or you notice a red or orange light on it please call the company at [phone].  Once you remove the patch please mail it back to the company as soon as possible so we can get the results.  Your physician has requested that you have an echocardiogram. Echocardiography is a painless test that uses sound waves to create images of your heart. It provides your doctor with information about the size and shape of your heart and how well your heart's chambers and valves are working. This procedure takes approximately one hour. There are no restrictions for this procedure.  You physician recommends that you schedule a follow up visit with an echocardiogram in 6-8 weeks  If you have any questions or concerns before your next appointment please send [place] a message through [place] or call our office at [phone].    TO LEAVE A MESSAGE FOR THE NURSE SELECT OPTION 2, PLEASE LEAVE A MESSAGE INCLUDING: . YOUR NAME . DATE OF BIRTH . CALL BACK NUMBER . REASON FOR CALL**this is important as we prioritize the call backs  YOU WILL RECEIVE A CALL BACK THE SAME DAY AS LONG AS YOU CALL BEFORE 4:00 PM

## 2020-10-04 NOTE — Progress Notes (Addendum)
Advanced Heart Failure Clinic Note   Referring Physician: PCP: Default, Provider, MD PCP-Cardiologist: No primary care provider on file.   HPI:  Brandon Huber is a 29 year old with morbid obesity,severehypertension and tobacco abuse. He presented to [facility] on 7/20 with acute HF. Echocardiogram  EF < 25%, severely dilated LV 1-2+ Brandon. Left heart catheterization revealed no significant CAD. RHC with wedge of 20, Fick CO/CI 4.02/1.7. He was diuresed approximately 20 pounds. He underwent cardiac MRI that showed no signs of infiltration but severely dilated LV and RV. He was placed on carvedilol, Entresto and Lasix at discharge. He was also given a LifeVest and referred to the [facility] for further management.   He was seen for the first time in HF Clinic on 09/28/19. He presented w/ NYHA IIIB-IV symptoms and marked volume overload in the setting of medication non compliance. He was directed admitted to hospital from clinic. Echo EF <20%. He was placed on IV lasix. He had good diuresis and did not require inotrope support. He diuresed 58 pounds.Discharge wt was 101 kg.  He presents back to clinic for f/u. Here with his mom. Says he is doing ok. Not very active. Says he hurt his back and it is still sore. Says he takes his meds regularly. Edema well controlled. No SOB unless he is really walking fast or trying to jog. No dizziness. Says he is not always weighing himself regularly. Previously on torsemide 40 daily but was having ab bloating and orthopnea so he increased torsemide to 40/20. Weight up 11 pounds here. Wears a FitBit gets 9-10k. Says resting HR 80 but quickly goes up to 100-110   Echo 01/04/20 EF 20-25% RV ok   2D Echo 09/28/19  EF 15%. Mild RV dysfunction    Current Outpatient Medications  Medication Sig Dispense Refill  . carvedilol (COREG) 12.5 MG tablet Take 1 tablet (12.5 mg total) by mouth 2 (two) times daily with a meal. 60 tablet 6  . dapagliflozin propanediol (FARXIGA) 10 MG TABS  tablet Take 10 mg by mouth daily before breakfast. 30 tablet 11  . digoxin (LANOXIN) 0.125 MG tablet Take 1 tablet (0.125 mg total) by mouth daily. 30 tablet 6  . ivabradine (CORLANOR) 7.5 MG TABS tablet Take 1 tablet (7.5 mg total) by mouth 2 (two) times daily with a meal. 30 tablet 3  . potassium chloride SA (KLOR-CON) 20 MEQ tablet Take 40 mEq by mouth 2 (two) times daily.    . sacubitril-valsartan (ENTRESTO) 97-103 MG Take 1 tablet by mouth daily.    Marland Kitchen spironolactone (ALDACTONE) 25 MG tablet Take 25 mg by mouth daily.    Marland Kitchen torsemide (DEMADEX) 20 MG tablet TAKE 2 TABLETS (40 MG) BY MOUTH DAILY 180 tablet 3   No current facility-administered medications for this encounter.    No Known Allergies    Social History   Socioeconomic History  . Marital status: Unknown    Spouse name: Not on file  . Number of children: Not on file  . Years of education: Not on file  . Highest education level: Not on file  Occupational History  . Not on file  Tobacco Use  . Smoking status: Never Smoker  . Smokeless tobacco: Never Used  Substance and Sexual Activity  . Alcohol use: Never  . Drug use: Not on file  . Sexual activity: Not on file  Other Topics Concern  . Not on file  Social History Narrative  . Not on file   Social Determinants  of Health   Financial Resource Strain: Low Risk   . Difficulty of Paying Living Expenses: Not very hard  Food Insecurity: No Food Insecurity  . Worried About Programme researcher, broadcasting/film/video in the Last Year: Never true  . Ran Out of Food in the Last Year: Never true  Transportation Needs: No Transportation Needs  . Lack of Transportation (Medical): No  . Lack of Transportation (Non-Medical): No  Physical Activity: Insufficiently Active  . Days of Exercise per Week: 5 days  . Minutes of Exercise per Session: 20 min  Stress: No Stress Concern Present  . Feeling of Stress : Not at all  Social Connections:   . Frequency of Communication with Friends and Family: Not  on file  . Frequency of Social Gatherings with Friends and Family: Not on file  . Attends Religious Services: Not on file  . Active Member of Clubs or Organizations: Not on file  . Attends Banker Meetings: Not on file  . Marital Status: Not on file  Intimate Partner Violence:   . Fear of Current or Ex-Partner: Not on file  . Emotionally Abused: Not on file  . Physically Abused: Not on file  . Sexually Abused: Not on file    Vitals:   10/04/20 1329  BP: 120/86  Pulse: (!) 120  SpO2: 98%  Weight: 131.9 kg (290 lb 12.8 oz)   Wt Readings from Last 3 Encounters:  10/04/20 131.9 kg (290 lb 12.8 oz)  05/14/20 126.8 kg (279 lb 9.6 oz)  03/14/20 125.9 kg (277 lb 9.6 oz)     PHYSICAL EXAM: General:  Obese male. Appears anxious No resp difficulty HEENT: normal Neck: supple. no JVD. Carotids 2+ bilat; no bruits. No lymphadenopathy or thryomegaly appreciated. Cor: PMI nondisplaced. Tachy regular. No rubs, gallops or murmurs. Lungs: clear Abdomen: obese soft, nontender, nondistended. No hepatosplenomegaly. No bruits or masses. Good bowel sounds. Extremities: no cyanosis, clubbing, rash, edema Neuro: alert & orientedx3, cranial nerves grossly intact. moves all 4 extremities w/o difficulty. Affect pleasant  ECG: Sinus tach 126 Personally reviewed   ASSESSMENT & PLAN:  1. Chronic systolic HF:   - Diagnosed  06/2019 at [facility]. LVEF at 20% w/ biventricular failure. LHC 06/2019 showed normal coronaries. RHC showed mean wedge of 20, FICK CO/CI 4.02/1.7. cMRI showed no signs of infiltration but severely dilated LV and RV.  - Echo at [facility] 09/29/19 showed EF at 15% no LVH, has mildly reduced RV systolic dysfunction w/ normal PA systolic pressure. Mild Brandon.  - ? Etiology. ?OSA, hypertensive heart disease vs familial (father w/ CHF) - Echo 01/04/20  EF 20-25%\ - Functional status hard to assess. Probably NYHA II-early III - Volume status ok. Continue torsemide 40/20  -  ContinueEntresto 97/103  Bid  - Continue spironolactone 25 mg daily.  - Continue Digoxin 0.125 - Continue carvedilol to 12.5  - Continue Farxiga 10 daily - Continues with prominent tachycardia. Unsure if white coat syndrome or due to HF. Place Zio patch to monitor HR - Recently we had long discussion about pros/cons of proceeding with ICD or waiting another few months to see if EF will continue to recover. He has decided to wait - Blood type AB+. Would be a good transplant candidate if HF dose not improve w/ medical management.  - Check labs - See back in 1-2 months with repeat ech  2. HTN: - Blood pressure well controlled. Continue current regimen.  3. Obesity: - Had sleep study in 2/21  with Dr. Mayford Knife  which was non-diagnostic due to no sleep. Will need in-lab studay.  - will try t0schedule in lab sleep study. Suspect he has significant OSA - reinforced need for weight loss and to remain active.  4. Tobacco Abuse:  - remains quit. No change   Arvilla Meres, MD 10/04/20

## 2020-10-04 NOTE — Progress Notes (Signed)
Zio patch placed onto patient.  All instructions and information reviewed with patient, they verbalize understanding with no questions. 

## 2020-10-05 LAB — T3, FREE: T3, Free: 3 pg/mL (ref 2.0–4.4)

## 2020-10-11 ENCOUNTER — Telehealth (HOSPITAL_COMMUNITY): Payer: Self-pay | Admitting: *Deleted

## 2020-10-11 NOTE — Telephone Encounter (Signed)
Agreed. We should have enough data after 4 days for him.

## 2020-10-11 NOTE — Telephone Encounter (Signed)
Pt called stating his 14 day zio patch fell off after 4days and he wanted to know if he needed a new device. Pt has tried to replace zio but it will not stick. I advised patient to mail in the zio patch and I will notify Dr.Bensimhon and see if we need to place a new patch.  Routed to Dr.Bensimhon for advice

## 2020-11-21 ENCOUNTER — Ambulatory Visit (HOSPITAL_BASED_OUTPATIENT_CLINIC_OR_DEPARTMENT_OTHER)
Admission: RE | Admit: 2020-11-21 | Discharge: 2020-11-21 | Disposition: A | Discharge status: Home / Self Care | Payer: 59 | Source: Ambulatory Visit | Attending: Internal Medicine | Admitting: Internal Medicine

## 2020-11-21 ENCOUNTER — Ambulatory Visit (HOSPITAL_COMMUNITY)
Admission: RE | Admit: 2020-11-21 | Discharge: 2020-11-21 | Disposition: A | Discharge status: Home / Self Care | Payer: 59 | Source: Ambulatory Visit | Attending: Internal Medicine | Admitting: Internal Medicine

## 2020-11-21 ENCOUNTER — Other Ambulatory Visit: Payer: Self-pay

## 2020-11-21 ENCOUNTER — Telehealth (HOSPITAL_COMMUNITY): Payer: Self-pay | Admitting: *Deleted

## 2020-11-21 ENCOUNTER — Encounter (HOSPITAL_COMMUNITY): Payer: Self-pay | Admitting: Internal Medicine

## 2020-11-21 VITALS — BP 130/80 | HR 113 | Wt 265.6 lb

## 2020-11-21 DIAGNOSIS — R000 Tachycardia, unspecified: Secondary | ICD-10-CM

## 2020-11-21 DIAGNOSIS — Z6836 Body mass index (BMI) 36.0-36.9, adult: Secondary | ICD-10-CM | POA: Insufficient documentation

## 2020-11-21 DIAGNOSIS — I340 Nonrheumatic mitral (valve) insufficiency: Secondary | ICD-10-CM | POA: Insufficient documentation

## 2020-11-21 DIAGNOSIS — Z79899 Other long term (current) drug therapy: Secondary | ICD-10-CM | POA: Insufficient documentation

## 2020-11-21 DIAGNOSIS — I10 Essential (primary) hypertension: Secondary | ICD-10-CM

## 2020-11-21 DIAGNOSIS — I5023 Acute on chronic systolic (congestive) heart failure: Secondary | ICD-10-CM

## 2020-11-21 DIAGNOSIS — I5022 Chronic systolic (congestive) heart failure: Secondary | ICD-10-CM | POA: Insufficient documentation

## 2020-11-21 DIAGNOSIS — I5082 Biventricular heart failure: Secondary | ICD-10-CM | POA: Diagnosis not present

## 2020-11-21 DIAGNOSIS — Z9114 Patient's other noncompliance with medication regimen: Secondary | ICD-10-CM | POA: Insufficient documentation

## 2020-11-21 DIAGNOSIS — I110 Hypertensive heart disease with heart failure: Secondary | ICD-10-CM | POA: Diagnosis not present

## 2020-11-21 DIAGNOSIS — Z7901 Long term (current) use of anticoagulants: Secondary | ICD-10-CM | POA: Insufficient documentation

## 2020-11-21 HISTORY — DX: Heart failure, unspecified: I50.9

## 2020-11-21 LAB — BASIC METABOLIC PANEL
Anion gap: 12 (ref 5–15)
BUN: 8 mg/dL (ref 6–20)
CO2: 27 mmol/L (ref 22–32)
Calcium: 9.5 mg/dL (ref 8.9–10.3)
Chloride: 97 mmol/L — ABNORMAL LOW (ref 98–111)
Creatinine, Ser: 1.12 mg/dL (ref 0.61–1.24)
GFR, Estimated: >60 mL/min (ref >60)
Glucose, Bld: 125 mg/dL — ABNORMAL HIGH (ref 70–99)
Potassium: 3.3 mmol/L — ABNORMAL LOW (ref 3.5–5.1)
Sodium: 136 mmol/L (ref 135–145)

## 2020-11-21 LAB — ECHOCARDIOGRAM COMPLETE
Area-P 1/2: 9.6 cm2
MV M vel: 4.19 m/s
MV Peak grad: 70.2 mmHg
S' Lateral: 7.1 cm
Single Plane A4C EF: 26.6 %

## 2020-11-21 LAB — DIGOXIN LEVEL: Digoxin Level: <0.2 ng/mL — ABNORMAL LOW (ref 1.0–2.0)

## 2020-11-21 MED ORDER — POTASSIUM CHLORIDE CRYS ER 20 MEQ PO TBCR
80.0000 meq | EXTENDED_RELEASE_TABLET | Freq: Two times a day (BID) | ORAL | 3 refills | Status: DC
Start: 2020-11-21 — End: 2021-02-25

## 2020-11-21 MED ORDER — IVABRADINE HCL 7.5 MG PO TABS
7.5000 mg | ORAL_TABLET | Freq: Two times a day (BID) | ORAL | 3 refills | Status: DC
Start: 2020-11-21 — End: 2021-08-21

## 2020-11-21 NOTE — Progress Notes (Signed)
  Echocardiogram 2D Echocardiogram has been performed.  Burnard Hawthorne 11/21/2020, 11:43 AM

## 2020-11-21 NOTE — Telephone Encounter (Signed)
-----   Message from Dolores Patty, MD sent at 11/21/2020  2:18 PM EST ----- Is he taking his potassium? If not restart 40 bid. If he is already taking this, give him 60 meq today and start 40 bid. Recheck 1 week.

## 2020-11-21 NOTE — Progress Notes (Signed)
Advanced Heart Failure Clinic Note   Referring Physician: PCP: Default, Provider, MD PCP-Cardiologist: No primary care provider on file.   HPI:  Brandon Huber is a 29 year old with morbid obesity,severe hypertension and tobacco abuse. He presented to [facility] on 7/20 with acute HF. Echocardiogram  EF < 25%, severely dilated LV 1-2+ Brandon. Left heart catheterization revealed no significant CAD. RHC with wedge of 20, Fick CO/CI 4.02/1.7. He was diuresed approximately 20 pounds. He underwent cardiac MRI that showed no signs of infiltration but severely dilated LV and RV. He was placed on carvedilol, Entresto and Lasix at discharge. He was also given a LifeVest and referred to the [facility] for further management.   He was seen for the first time in HF Clinic on 09/28/19. He presented w/ NYHA IIIB-IV symptoms and marked volume overload in the setting of medication non compliance. He was directed admitted to hospital from clinic. Echo EF <20%. He was placed on IV lasix. He had good diuresis and did not require inotrope support. He diuresed 58 pounds. Discharge wt was 101 kg.  He presents back to clinic for f/u. Here with his mom. Says he is feeling good, weight down 25 lbs with dietary changes and 1-2x/week activity of 25-30 minutes. Started new job as a Contractor. No SOB, CP, dizziness, palpitations, PND, orthopnea. Says he takes his meds regularly. Sleeping better~2-4 hours during the day, little daytime sleepiness. Edema well controlled on Torsemide 40 daily. Weights at home have been 260-262 lbs.  No SOB unless he is really walking fast or trying to jog. Wears a FitBit & says resting HR 90-110 but quickly goes up to 14-150 with exercise. Been out of a med for 1 month, cannot remember which medicine.  Echo 09/28/19 EF 15%. Mild RV dysfunction  Echo 01/04/20 EF 20-25% RV ok   Echo today 11/21 EF 25%  cMRI 7/20 ([facility]): Markedly dilated left ventricle, dilated bilateral atria, global  hypokinesis and reduced ejection fraction. Mild to moderate mitral valve regurgitation.   ZIO 10/21: SR average HR 111, rare PVC/PACs, no high-grade arrhythmias  Current Outpatient Medications  Medication Sig Dispense Refill  . carvedilol (COREG) 12.5 MG tablet Take 1 tablet (12.5 mg total) by mouth 2 (two) times daily with a meal. 60 tablet 6  . dapagliflozin propanediol (FARXIGA) 10 MG TABS tablet Take 10 mg by mouth daily before breakfast. 30 tablet 11  . digoxin (LANOXIN) 0.125 MG tablet Take 1 tablet (0.125 mg total) by mouth daily. 30 tablet 6  . potassium chloride SA (KLOR-CON) 20 MEQ tablet Take 40 mEq by mouth 2 (two) times daily.    . sacubitril-valsartan (ENTRESTO) 97-103 MG Take 1 tablet by mouth daily.    Marland Kitchen spironolactone (ALDACTONE) 25 MG tablet Take 25 mg by mouth daily.    Marland Kitchen torsemide (DEMADEX) 20 MG tablet TAKE 2 TABLETS (40 MG) BY MOUTH DAILY 180 tablet 3  . ivabradine (CORLANOR) 7.5 MG TABS tablet Take 1 tablet (7.5 mg total) by mouth 2 (two) times daily with a meal. 30 tablet 3   No current facility-administered medications for this encounter.    No Known Allergies    Social History   Socioeconomic History  . Marital status: Unknown    Spouse name: Not on file  . Number of children: Not on file  . Years of education: Not on file  . Highest education level: Not on file  Occupational History  . Not on file  Tobacco Use  .  Smoking status: Never Smoker  . Smokeless tobacco: Never Used  Substance and Sexual Activity  . Alcohol use: Never  . Drug use: Not on file  . Sexual activity: Not on file  Other Topics Concern  . Not on file  Social History Narrative  . Not on file   Social Determinants of Health   Financial Resource Strain: Low Risk   . Difficulty of Paying Living Expenses: Not very hard  Food Insecurity: No Food Insecurity  . Worried About Programme researcher, broadcasting/film/video in the Last Year: Never true  . Ran Out of Food in the Last Year: Never true   Transportation Needs: No Transportation Needs  . Lack of Transportation (Medical): No  . Lack of Transportation (Non-Medical): No  Physical Activity: Insufficiently Active  . Days of Exercise per Week: 5 days  . Minutes of Exercise per Session: 20 min  Stress: No Stress Concern Present  . Feeling of Stress : Not at all  Social Connections:   . Frequency of Communication with Friends and Family: Not on file  . Frequency of Social Gatherings with Friends and Family: Not on file  . Attends Religious Services: Not on file  . Active Member of Clubs or Organizations: Not on file  . Attends Banker Meetings: Not on file  . Marital Status: Not on file  Intimate Partner Violence:   . Fear of Current or Ex-Partner: Not on file  . Emotionally Abused: Not on file  . Physically Abused: Not on file  . Sexually Abused: Not on file    Vitals:   11/21/20 1207  BP: 130/80  Pulse: (!) 113  SpO2: 98%  Weight: 120.5 kg (265 lb 9.6 oz)   Wt Readings from Last 3 Encounters:  11/21/20 120.5 kg (265 lb 9.6 oz)  10/04/20 131.9 kg (290 lb 12.8 oz)  05/14/20 126.8 kg (279 lb 9.6 oz)   Body mass index is 36.02 kg/m.  PHYSICAL EXAM: General:  Well appearing. No resp difficulty HEENT: normal Neck: supple. no JVD. Carotids 2+ bilat; no bruits. No lymphadenopathy or thryomegaly appreciated. Cor: PMI nondisplaced. Tachy rate & rhythm. No rubs, gallops or murmurs. Lungs: clear Abdomen: obese, soft, nontender, nondistended. No hepatosplenomegaly. No bruits or masses. Good bowel sounds. Extremities: no cyanosis, clubbing, rash, edema Neuro: alert & orientedx3, cranial nerves grossly intact. moves all 4 extremities w/o difficulty. Affect pleasant  ECG: ST 121 bpm (personally reviewed)  ASSESSMENT & PLAN:  1. Chronic systolic HF:   - Diagnosed  06/2019 at [facility]. LVEF at 20% w/ biventricular failure. LHC 06/2019 showed normal coronaries. RHC showed mean wedge of 20, FICK  CO/CI 4.02/1.7. cMRI showed no signs of infiltration but severely dilated LV and RV.  - Echo at [facility] 09/29/19 showed EF at 15% no LVH, has mildly reduced RV systolic dysfunction w/ normal PA systolic pressure. Mild Brandon.  - ? Etiology. ?OSA, hypertensive heart disease vs familial (father w/ CHF) - NYHA II, Volume status looks good - Echo 01/04/20  EF 20-25% - Echo today 11/21 EF <20%, RV moderate to severely reduced - Still tachycardic on exam and ECG; (Zio monitor 10/21) showed SR average HR 111, no high-grade arrhythmias, rare PACs/PVs - Continue Corlanor 7.5mg  BID (will check at home to make sure if this is the med he ran out of) - Continue torsemide 40  - ContinueEntresto 97/103  Bid  - Continue spironolactone 25 mg daily.  - Continue Digoxin 0.125 - Continue carvedilol to 12.5  -  Continue Farxiga 10 daily - He has been much more complaint with therapy and also has lost weight. Now likely NYHA II. But still quite tachycardic. EF not improving with aggressive medical therapy. Will start working toward transplant referral. (Not ideal VAD candidate with RV dysfunction). Will get CPX and gentic testing. Return in 6 weeks and will refer to [facility] and also for ICD. Body mass index is 36.02 kg/m. - Blood type AB+. Would be a good transplant candidate if HF dose not improve w/ medical management.  - BMET & Dig level today  2. HTN: - Blood pressure controlled. Continue current regimen.  3. Obesity: - pleased with weight loss of 25lbs, BMI 36 - Had sleep study in 2/21 with Dr. Mayford Knife  which was non-diagnostic due to no sleep. Will need in-lab studay.  - will schedule in lab sleep study. Suspect he has significant OSA  4. Tobacco Abuse:  - remains quit. No change  Total time spent 35 minutes. Over half that time spent discussing above.     Arvilla Meres, MD 11/21/20

## 2020-11-21 NOTE — Patient Instructions (Addendum)
A refill of Corlanor 7.5 mg, one tab twice a day was sent to your pharmacy  Labs today We will only contact you if something comes back abnormal or we need to make some changes. Otherwise no news is good news!   Your physician has recommended that you have a cardiopulmonary stress test (CPX). CPX testing is a non-invasive measurement of heart and lung function. It replaces a traditional treadmill stress test. This type of test provides a tremendous amount of information that relates not only to your present condition but also for future outcomes. This test combines measurements of you ventilation, respiratory gas exchange in the lungs, electrocardiogram (EKG), blood pressure and physical response before, during, and following an exercise protocol.   Your physician has recommended that you have a sleep study. This test records several body functions during sleep, including: brain activity, eye movement, oxygen and carbon dioxide blood levels, heart rate and rhythm, breathing rate and rhythm, the flow of air through your mouth and nose, snoring, body muscle movements, and chest and belly movement.    Keep follow up as scheduled in January 2022  If you have any questions or concerns before your next appointment please send [place] a message through [place] or call our office at [phone].    TO LEAVE A MESSAGE FOR THE NURSE SELECT OPTION 2, PLEASE LEAVE A MESSAGE INCLUDING: . YOUR NAME . DATE OF BIRTH . CALL BACK NUMBER . REASON FOR CALL**this is important as we prioritize the call backs  YOU WILL RECEIVE A CALL BACK THE SAME DAY AS LONG AS YOU CALL BEFORE 4:00 PM

## 2020-11-21 NOTE — Telephone Encounter (Signed)
Spoke w/pt he states he has been compliant with KCL taking it 80 meq Daily, he will increase to 80 meq BID, he will go to [facility] in [place] next week for labs, order faxed to them at [phone]

## 2020-11-21 NOTE — Progress Notes (Signed)
Patient Name: Brandon Huber        DOB: [birth year]      Height: 6'   Weight: 265  Office Name:Advanced Heart Failure Clinic         Referring Provider:Daniel Bensimhon, MD  Today's Date:11/21/2020   STOP BANG RISK ASSESSMENT S (snore) Have you been told that you snore?     YES   T (tired) Are you often tired, fatigued, or sleepy during the day?   YES  O (obstruction) Do you stop breathing, choke, or gasp during sleep? YES   P (pressure) Do you have or are you being treated for high blood pressure? NO   B (BMI) Is your body index greater than 35 kg/m? YES   A (age) Are you 11 years old or older? NO   N (neck) Do you have a neck circumference greater than 16 inches?   NO   G (gender) Are you a male? YES   TOTAL STOP/BANG YES ANSWERS                                                                        For Office Use Only              Procedure Order Form    YES to 3+ Stop Bang questions OR two clinical symptoms - patient qualifies for WatchPAT (CPT 95800)     Submit: This Form + Patient Face Sheet + Clinical Note via CloudPAT or Fax: [phone]         Clinical Notes: Will consult Sleep Specialist and refer for management of therapy due to patient increased risk of Sleep Apnea. Ordering a sleep study due to the following two clinical symptoms: Excessive daytime sleepiness G47.10 / Gastroesophageal reflux K21.9 / Nocturia R35.1 / Morning Headaches G44.221 / Difficulty concentrating R41.840 / Memory problems or poor judgment G31.84 / Personality changes or irritability R45.4 / Loud snoring R06.83 / Depression F32.9 / Unrefreshed by sleep G47.8 / Impotence N52.9 / History of high blood pressure R03.0 / Insomnia G47.00    I understand that I am proceeding with a home sleep apnea test as ordered by my treating physician. I understand that untreated sleep apnea is a serious cardiovascular risk factor and it is my responsibility to perform the test and seek management for  sleep apnea. I will be contacted with the results and be managed for sleep apnea by a local sleep physician. I will be receiving equipment and further instructions from [facility]. I shall promptly ship back the equipment via the included mailing label. I understand my insurance will be billed for the test and as the patient I am responsible for any insurance related out-of-pocket costs incurred. I have been provided with written instructions and can call for additional video or telephonic instruction, with 24-hour availability of qualified personnel to answer any questions: Patient Help Desk [phone].  Patient Signature ______________________________________________________   Date______________________ Patient Telemedicine Verbal Consent

## 2020-12-04 ENCOUNTER — Other Ambulatory Visit (HOSPITAL_COMMUNITY): Payer: Self-pay | Admitting: Adult Health

## 2020-12-17 ENCOUNTER — Encounter (HOSPITAL_COMMUNITY): Payer: 59

## 2020-12-18 ENCOUNTER — Other Ambulatory Visit (HOSPITAL_COMMUNITY): Payer: Self-pay | Admitting: Adult Health

## 2020-12-24 ENCOUNTER — Encounter (HOSPITAL_COMMUNITY): Payer: Self-pay | Admitting: *Deleted

## 2021-01-03 ENCOUNTER — Other Ambulatory Visit (HOSPITAL_COMMUNITY): Payer: Self-pay | Admitting: Internal Medicine

## 2021-01-07 ENCOUNTER — Encounter (HOSPITAL_COMMUNITY): Payer: 59 | Admitting: Internal Medicine

## 2021-01-09 ENCOUNTER — Encounter (HOSPITAL_BASED_OUTPATIENT_CLINIC_OR_DEPARTMENT_OTHER): Payer: 59 | Admitting: Cardiology

## 2021-01-21 ENCOUNTER — Encounter (HOSPITAL_COMMUNITY): Payer: Self-pay

## 2021-01-21 NOTE — Progress Notes (Unsigned)
Received a fax requesting medical records from [employer]. Records were successfully faxed to: [phone] ,which was the number provided.. Medical request form will be scanned into patients chart.  ° °

## 2021-01-22 ENCOUNTER — Encounter (HOSPITAL_COMMUNITY): Payer: 59

## 2021-01-23 ENCOUNTER — Encounter (HOSPITAL_COMMUNITY): Payer: Self-pay | Admitting: Internal Medicine

## 2021-01-23 ENCOUNTER — Ambulatory Visit (HOSPITAL_COMMUNITY)
Admission: RE | Admit: 2021-01-23 | Discharge: 2021-01-23 | Disposition: A | Discharge status: Home / Self Care | Payer: 59 | Source: Ambulatory Visit | Attending: Internal Medicine | Admitting: Internal Medicine

## 2021-01-23 ENCOUNTER — Other Ambulatory Visit: Payer: Self-pay

## 2021-01-23 ENCOUNTER — Other Ambulatory Visit (HOSPITAL_COMMUNITY): Payer: Self-pay

## 2021-01-23 VITALS — BP 100/60 | HR 129 | Wt 249.0 lb

## 2021-01-23 DIAGNOSIS — Z7984 Long term (current) use of oral hypoglycemic drugs: Secondary | ICD-10-CM | POA: Diagnosis not present

## 2021-01-23 DIAGNOSIS — Z8616 Personal history of COVID-19: Secondary | ICD-10-CM | POA: Insufficient documentation

## 2021-01-23 DIAGNOSIS — Z6833 Body mass index (BMI) 33.0-33.9, adult: Secondary | ICD-10-CM | POA: Insufficient documentation

## 2021-01-23 DIAGNOSIS — I5023 Acute on chronic systolic (congestive) heart failure: Secondary | ICD-10-CM | POA: Diagnosis not present

## 2021-01-23 DIAGNOSIS — I110 Hypertensive heart disease with heart failure: Secondary | ICD-10-CM | POA: Diagnosis present

## 2021-01-23 DIAGNOSIS — R000 Tachycardia, unspecified: Secondary | ICD-10-CM

## 2021-01-23 DIAGNOSIS — E669 Obesity, unspecified: Secondary | ICD-10-CM | POA: Diagnosis not present

## 2021-01-23 DIAGNOSIS — I509 Heart failure, unspecified: Secondary | ICD-10-CM | POA: Diagnosis not present

## 2021-01-23 DIAGNOSIS — Z79899 Other long term (current) drug therapy: Secondary | ICD-10-CM | POA: Diagnosis not present

## 2021-01-23 DIAGNOSIS — I340 Nonrheumatic mitral (valve) insufficiency: Secondary | ICD-10-CM | POA: Diagnosis not present

## 2021-01-23 DIAGNOSIS — I10 Essential (primary) hypertension: Secondary | ICD-10-CM

## 2021-01-23 DIAGNOSIS — I5082 Biventricular heart failure: Secondary | ICD-10-CM | POA: Diagnosis not present

## 2021-01-23 LAB — COMPREHENSIVE METABOLIC PANEL
ALT: 62 U/L — ABNORMAL HIGH (ref 0–44)
AST: 81 U/L — ABNORMAL HIGH (ref 15–41)
Albumin: 3.6 g/dL (ref 3.5–5.0)
Alkaline Phosphatase: 95 U/L (ref 38–126)
Anion gap: 14 (ref 5–15)
BUN: 14 mg/dL (ref 6–20)
CO2: 27 mmol/L (ref 22–32)
Calcium: 8.7 mg/dL — ABNORMAL LOW (ref 8.9–10.3)
Chloride: 88 mmol/L — ABNORMAL LOW (ref 98–111)
Creatinine, Ser: 1.38 mg/dL — ABNORMAL HIGH (ref 0.61–1.24)
GFR, Estimated: >60 mL/min (ref >60)
Glucose, Bld: 112 mg/dL — ABNORMAL HIGH (ref 70–99)
Potassium: 3.3 mmol/L — ABNORMAL LOW (ref 3.5–5.1)
Sodium: 129 mmol/L — ABNORMAL LOW (ref 135–145)
Total Bilirubin: 3.8 mg/dL — ABNORMAL HIGH (ref 0.3–1.2)
Total Protein: 7 g/dL (ref 6.5–8.1)

## 2021-01-23 LAB — CBC
HCT: 44.4 % (ref 39.0–52.0)
Hemoglobin: 14.9 g/dL (ref 13.0–17.0)
MCH: 26.7 pg (ref 26.0–34.0)
MCHC: 33.6 g/dL (ref 30.0–36.0)
MCV: 79.6 fL — ABNORMAL LOW (ref 80.0–100.0)
Platelets: 239 10*3/uL (ref 150–400)
RBC: 5.58 MIL/uL (ref 4.22–5.81)
RDW: 15.3 % (ref 11.5–15.5)
WBC: 8.4 10*3/uL (ref 4.0–10.5)
nRBC: 0 % (ref 0.0–0.2)

## 2021-01-23 LAB — TSH: TSH: 3.008 u[IU]/mL (ref 0.350–4.500)

## 2021-01-23 LAB — DIGOXIN LEVEL: Digoxin Level: <0.2 ng/mL — ABNORMAL LOW (ref 0.8–2.0)

## 2021-01-23 LAB — T4, FREE: Free T4: 1.66 ng/dL — ABNORMAL HIGH (ref 0.61–1.12)

## 2021-01-23 LAB — BRAIN NATRIURETIC PEPTIDE: B Natriuretic Peptide: 1342.4 pg/mL — ABNORMAL HIGH (ref 0.0–100.0)

## 2021-01-23 NOTE — H&P (View-Only) (Signed)
Advanced Heart Failure Clinic Note   Referring Physician: PCP: Default, Provider, MD PCP-Cardiologist: No primary care provider on file.   HPI:  Brandon Huber is a 30 year old with morbid obesity,severe hypertension and tobacco abuse. He presented to [facility] on 7/20 with acute HF. Echocardiogram  EF < 25%, severely dilated LV 1-2+ Brandon. Left heart catheterization revealed no significant CAD. RHC with wedge of 20, Fick CO/CI 4.02/1.7. He was diuresed approximately 20 pounds. He underwent cardiac MRI that showed no signs of infiltration but severely dilated LV and RV. He was placed on carvedilol, Entresto and Lasix at discharge. He was also given a LifeVest and referred to the [facility] for further management.   He was seen for the first time in HF Clinic on 09/28/19. He presented w/ NYHA IIIB-IV symptoms and marked volume overload in the setting of medication non compliance. He was directed admitted to hospital from clinic. Echo EF <20%. He was placed on IV lasix. He had good diuresis and did not require inotrope support. He diuresed 58 pounds. Discharge wt was 101 kg.  He presents back to clinic for f/u. Here with his mom. Had Covid earlier this month. Main symptom was cough. Says he feels ok. Breathing is "good". Still with orthopnea. Gets around the house and can do ADLs. Not exercising since he had COVID. Reports compliance with meds. Weight down 50 pounds.    Echo 09/28/19 EF 15%. Mild RV dysfunction  Echo 01/04/20 EF 20-25% RV ok   Echo 11/21 EF 25%  cMRI 7/20 ([facility]): Markedly dilated left ventricle, dilated bilateral atria, global hypokinesis and reduced ejection fraction. Mild to moderate mitral valve regurgitation.   ZIO 10/21: SR average HR 111, rare PVC/PACs, no high-grade arrhythmias  Current Outpatient Medications  Medication Sig Dispense Refill  . carvedilol (COREG) 12.5 MG tablet Take 1 tablet (12.5 mg total) by mouth 2 (two) times daily with a meal. 60 tablet 6  . dapagliflozin  propanediol (FARXIGA) 10 MG TABS tablet Take 10 mg by mouth daily before breakfast. 30 tablet 11  . digoxin (LANOXIN) 0.125 MG tablet Take 1 tablet (125 mcg total) by mouth daily. 90 tablet 2  . ivabradine (CORLANOR) 7.5 MG TABS tablet Take 1 tablet (7.5 mg total) by mouth 2 (two) times daily with a meal. 30 tablet 3  . potassium chloride SA (KLOR-CON) 20 MEQ tablet Take 4 tablets (80 mEq total) by mouth 2 (two) times daily. 240 tablet 3  . sacubitril-valsartan (ENTRESTO) 97-103 MG Take 1 tablet by mouth daily.    Marland Kitchen spironolactone (ALDACTONE) 25 MG tablet TAKE 1 TABLET BY MOUTH EVERY DAY 90 tablet 1  . torsemide (DEMADEX) 20 MG tablet TAKE 2 TABLETS (40 MG) BY MOUTH DAILY 180 tablet 3   No current facility-administered medications for this encounter.    No Known Allergies    Social History   Socioeconomic History  . Marital status: Unknown    Spouse name: Not on file  . Number of children: Not on file  . Years of education: Not on file  . Highest education level: Not on file  Occupational History  . Not on file  Tobacco Use  . Smoking status: Never Smoker  . Smokeless tobacco: Never Used  Substance and Sexual Activity  . Alcohol use: Never  . Drug use: Not on file  . Sexual activity: Not on file  Other Topics Concern  . Not on file  Social History Narrative  . Not on file   Social Determinants  of Health   Financial Resource Strain: Low Risk   . Difficulty of Paying Living Expenses: Not very hard  Food Insecurity: No Food Insecurity  . Worried About Running Out of Food in the Last Year: Never true  . Ran Out of Food in the Last Year: Never true  Transportation Needs: No Transportation Needs  . Lack of Transportation (Medical): No  . Lack of Transportation (Non-Medical): No  Physical Activity: Insufficiently Active  . Days of Exercise per Week: 5 days  . Minutes of Exercise per Session: 20 min  Stress: No Stress Concern Present  . Feeling of Stress : Not at all   Social Connections: Not on file  Intimate Partner Violence: Not on file    Vitals:   01/23/21 1216 01/23/21 1232  BP:  100/60  Pulse: (!) 129   SpO2: 98%   Weight: 112.9 kg (249 lb)    Wt Readings from Last 3 Encounters:  01/23/21 112.9 kg (249 lb)  11/21/20 120.5 kg (265 lb 9.6 oz)  10/04/20 131.9 kg (290 lb 12.8 oz)   Body mass index is 33.77 kg/m.  PHYSICAL EXAM: General:  Pale. Weak appearing.  Mildly diaphorretic  No resp difficulty HEENT: normal Neck: supple. no JVD. Carotids 2+ bilat; no bruits. No lymphadenopathy or thryomegaly appreciated. Cor: PMI nondisplaced. Regular tachy +s3 Lungs: clear Abdomen: obese soft, nontender, nondistended. No hepatosplenomegaly. No bruits or masses. Good bowel sounds. Extremities: no cyanosis, clubbing, rash, edema cool Neuro: alert & orientedx3, cranial nerves grossly intact. moves all 4 extremities w/o difficulty. Affect pleasant   ECG: ST 129 bpm with 1 PVC Personally reviewed  ASSESSMENT & PLAN:  1. Acute on chronic systolic HF:   - Diagnosed  06/2019 at [facility]. LVEF at 20% w/ biventricular failure. LHC 06/2019 showed normal coronaries. RHC showed mean wedge of 20, FICK CO/CI 4.02/1.7. cMRI showed no signs of infiltration but severely dilated LV and RV.  - Echo at [facility] 09/29/19 showed EF at 15% no LVH, has mildly reduced RV systolic dysfunction w/ normal PA systolic pressure. Mild Brandon.  - ? Etiology. ?OSA, hypertensive heart disease vs familial (father w/ CHF) - Echo 01/04/20  EF 20-25% - Echo 11/21 EF <20%, RV moderate to severely reduced - Zio monitor 10/21showed SR average HR 111, no high-grade arrhythmias, rare PACs/PVs - Continue Corlanor 7.5mg BID (will check at home to make sure if this is the med he ran out of) - Continue torsemide 40  - ContinueEntresto 97/103  Bid  - Continue spironolactone 25 mg daily.  - Continue Digoxin 0.125 - Continue carvedilol to 12.5  - Continue Farxiga 10 daily - He has been  much more compliant with therapy and also has lost weight. At last visit we had long discussion about starting transplant w/u. CPX ordered but not done yet. By reports NYHA II symptoms but looks low output on exam. Will plan RHC this week to further evalaute. Told him to call 911 if feeling worse before then - Body mass index is 33.77 kg/m. - Blood type AB+. Would be a good transplant candidate if HF dose not improve w/ medical management.  - Consider ICD if not pursuing advanced therapies soon  - Labs today   2. HTN: - BP ok .  3. Obesity: - Has lost 50 pounds. Body mass index is 33.77 kg/m. - Had sleep study in 2/21 with Dr. Turner  which was non-diagnostic due to no sleep. Will need in-lab studay.  - will schedule   in lab sleep study. Suspect he has significant OSA  4. Tobacco Abuse:  - remains quit. No change  Total time spent 35 minutes. Over half that time spent discussing above.     Arvilla Meres, MD 01/23/21

## 2021-01-23 NOTE — Progress Notes (Signed)
Advanced Heart Failure Clinic Note   Referring Physician: PCP: Default, Provider, MD PCP-Cardiologist: No primary care provider on file.   HPI:  [name] is a 30 year old with morbid obesity,severe hypertension and tobacco abuse. He presented to [facility] on 7/20 with acute HF. Echocardiogram  EF < 25%, severely dilated LV 1-2+ MR. Left heart catheterization revealed no significant CAD. RHC with wedge of 20, Fick CO/CI 4.02/1.7. He was diuresed approximately 20 pounds. He underwent cardiac MRI that showed no signs of infiltration but severely dilated LV and RV. He was placed on carvedilol, Entresto and Lasix at discharge. He was also given a LifeVest and referred to the [facility] for further management.   He was seen for the first time in HF Clinic on 09/28/19. He presented w/ NYHA IIIB-IV symptoms and marked volume overload in the setting of medication non compliance. He was directed admitted to hospital from clinic. Echo EF <20%. He was placed on IV lasix. He had good diuresis and did not require inotrope support. He diuresed 58 pounds. Discharge wt was 101 kg.  He presents back to clinic for f/u. Here with his mom. Had Covid earlier this month. Main symptom was cough. Says he feels ok. Breathing is "good". Still with orthopnea. Gets around the house and can do ADLs. Not exercising since he had COVID. Reports compliance with meds. Weight down 50 pounds.    Echo 09/28/19 EF 15%. Mild RV dysfunction  Echo 01/04/20 EF 20-25% RV ok   Echo 11/21 EF 25%  cMRI 7/20 ([facility]): Markedly dilated left ventricle, dilated bilateral atria, global hypokinesis and reduced ejection fraction. Mild to moderate mitral valve regurgitation.   ZIO 10/21: SR average HR 111, rare PVC/PACs, no high-grade arrhythmias  Current Outpatient Medications  Medication Sig Dispense Refill  . carvedilol (COREG) 12.5 MG tablet Take 1 tablet (12.5 mg total) by mouth 2 (two) times daily with a meal. 60 tablet 6  . dapagliflozin  propanediol (FARXIGA) 10 MG TABS tablet Take 10 mg by mouth daily before breakfast. 30 tablet 11  . digoxin (LANOXIN) 0.125 MG tablet Take 1 tablet (125 mcg total) by mouth daily. 90 tablet 2  . ivabradine (CORLANOR) 7.5 MG TABS tablet Take 1 tablet (7.5 mg total) by mouth 2 (two) times daily with a meal. 30 tablet 3  . potassium chloride SA (KLOR-CON) 20 MEQ tablet Take 4 tablets (80 mEq total) by mouth 2 (two) times daily. 240 tablet 3  . sacubitril-valsartan (ENTRESTO) 97-103 MG Take 1 tablet by mouth daily.    Marland Kitchen spironolactone (ALDACTONE) 25 MG tablet TAKE 1 TABLET BY MOUTH EVERY DAY 90 tablet 1  . torsemide (DEMADEX) 20 MG tablet TAKE 2 TABLETS (40 MG) BY MOUTH DAILY 180 tablet 3   No current facility-administered medications for this encounter.    No Known Allergies    Social History   Socioeconomic History  . Marital status: Unknown    Spouse name: Not on file  . Number of children: Not on file  . Years of education: Not on file  . Highest education level: Not on file  Occupational History  . Not on file  Tobacco Use  . Smoking status: Never Smoker  . Smokeless tobacco: Never Used  Substance and Sexual Activity  . Alcohol use: Never  . Drug use: Not on file  . Sexual activity: Not on file  Other Topics Concern  . Not on file  Social History Narrative  . Not on file   Social Determinants  of Health   Financial Resource Strain: Low Risk   . Difficulty of Paying Living Expenses: Not very hard  Food Insecurity: No Food Insecurity  . Worried About Programme researcher, broadcasting/film/video in the Last Year: Never true  . Ran Out of Food in the Last Year: Never true  Transportation Needs: No Transportation Needs  . Lack of Transportation (Medical): No  . Lack of Transportation (Non-Medical): No  Physical Activity: Insufficiently Active  . Days of Exercise per Week: 5 days  . Minutes of Exercise per Session: 20 min  Stress: No Stress Concern Present  . Feeling of Stress : Not at all   Social Connections: Not on file  Intimate Partner Violence: Not on file    Vitals:   01/23/21 1216 01/23/21 1232  BP:  100/60  Pulse: (!) 129   SpO2: 98%   Weight: 112.9 kg (249 lb)    Wt Readings from Last 3 Encounters:  01/23/21 112.9 kg (249 lb)  11/21/20 120.5 kg (265 lb 9.6 oz)  10/04/20 131.9 kg (290 lb 12.8 oz)   Body mass index is 33.77 kg/m.  PHYSICAL EXAM: General:  Pale. Weak appearing.  Mildly diaphorretic  No resp difficulty HEENT: normal Neck: supple. no JVD. Carotids 2+ bilat; no bruits. No lymphadenopathy or thryomegaly appreciated. Cor: PMI nondisplaced. Regular tachy +s3 Lungs: clear Abdomen: obese soft, nontender, nondistended. No hepatosplenomegaly. No bruits or masses. Good bowel sounds. Extremities: no cyanosis, clubbing, rash, edema cool Neuro: alert & orientedx3, cranial nerves grossly intact. moves all 4 extremities w/o difficulty. Affect pleasant   ECG: ST 129 bpm with 1 PVC Personally reviewed  ASSESSMENT & PLAN:  1. Acute on chronic systolic HF:   - Diagnosed  06/2019 at [facility]. LVEF at 20% w/ biventricular failure. LHC 06/2019 showed normal coronaries. RHC showed mean wedge of 20, FICK CO/CI 4.02/1.7. cMRI showed no signs of infiltration but severely dilated LV and RV.  - Echo at [facility] 09/29/19 showed EF at 15% no LVH, has mildly reduced RV systolic dysfunction w/ normal PA systolic pressure. Mild MR.  - ? Etiology. ?OSA, hypertensive heart disease vs familial (father w/ CHF) - Echo 01/04/20  EF 20-25% - Echo 11/21 EF <20%, RV moderate to severely reduced - Zio monitor 10/21showed SR average HR 111, no high-grade arrhythmias, rare PACs/PVs - Continue Corlanor 7.5mg  BID (will check at home to make sure if this is the med he ran out of) - Continue torsemide 40  - ContinueEntresto 97/103  Bid  - Continue spironolactone 25 mg daily.  - Continue Digoxin 0.125 - Continue carvedilol to 12.5  - Continue Farxiga 10 daily - He has been  much more compliant with therapy and also has lost weight. At last visit we had long discussion about starting transplant w/u. CPX ordered but not done yet. By reports NYHA II symptoms but looks low output on exam. Will plan RHC this week to further evalaute. Told him to call 911 if feeling worse before then - Body mass index is 33.77 kg/m. - Blood type AB+. Would be a good transplant candidate if HF dose not improve w/ medical management.  - Consider ICD if not pursuing advanced therapies soon  - Labs today   2. HTN: - BP ok .  3. Obesity: - Has lost 50 pounds. Body mass index is 33.77 kg/m. - Had sleep study in 2/21 with Dr. Mayford Knife  which was non-diagnostic due to no sleep. Will need in-lab studay.  - will schedule  in lab sleep study. Suspect he has significant OSA  4. Tobacco Abuse:  - remains quit. No change  Total time spent 35 minutes. Over half that time spent discussing above.     Arvilla Meres, MD 01/23/21

## 2021-01-23 NOTE — Addendum Note (Signed)
Encounter addended by: Dolores Patty, MD on: 01/23/2021 2:56 PM  Actions taken: Level of Service modified, Visit diagnoses modified

## 2021-01-23 NOTE — Patient Instructions (Signed)
Labs done today, your results will be available in MyChart, we will contact you for abnormal readings.  Labs done today, your results will be available in MyChart, we will contact you for abnormal readings.  Your physician recommends that you schedule a follow-up appointment in: 1 month    [facility] [address] [id number] [facility] [place] [address] Dept: [phone] Loc: [phone]  Brandon Huber  01/23/2021  You are scheduled for a Cardiac Catheterization on Friday, January 28 with Dr. Arvilla Meres.  1. Please arrive at the [facility] (Main Entrance A) at [facility]: [address] [place], [place] [address] at 7:00 AM (This time is two hours before your procedure to ensure your preparation). Free valet parking service is available.   Special note: Every effort is made to have your procedure done on time. Please understand that emergencies sometimes delay scheduled procedures.  2. Diet: Do not eat solid foods after midnight.  The patient may have clear liquids until 5am upon the day of the procedure.   4. Medication instructions in preparation for your procedure:  Morning if the procedure hold Farxiga,Spironolactone,Torsemide   On the morning of your procedure, take any morning medicines NOT listed above.  You may use sips of water.  5. Plan for one night stay--bring personal belongings. 6. Bring a current list of your medications and current insurance cards. 7. You MUST have a responsible person to drive you home. 8. Someone MUST be with you the first 24 hours after you arrive home or your discharge will be delayed. 9. Please wear clothes that are easy to get on and off and wear slip-on shoes.  Thank you for allowing [place] to care for you!   -- [facility]   If you have any questions or concerns before your next appointment please send [place] a  message through [facility] or call our office at [phone].    TO LEAVE A MESSAGE FOR THE NURSE SELECT OPTION 2, PLEASE LEAVE A MESSAGE INCLUDING: . YOUR NAME . DATE OF BIRTH . CALL BACK NUMBER . REASON FOR CALL**this is important as we prioritize the call backs  YOU WILL RECEIVE A CALL BACK THE SAME DAY AS LONG AS YOU CALL BEFORE 4:00 PM  At the Advanced Heart Failure Clinic, you and your health needs are our priority. As part of our continuing mission to provide you with exceptional heart care, we have created designated Provider Care Teams. These Care Teams include your primary Cardiologist (physician) and Advanced Practice Providers (APPs- Physician Assistants and Nurse Practitioners) who all work together to provide you with the care you need, when you need it.   You may see any of the following providers on your designated Care Team at your next follow up: Marland Kitchen Dr Arvilla Meres . Dr Marca Ancona . Tonye Becket, NP . Robbie Lis, PA . Shanda Bumps [place],NP . Karle Plumber, PharmD   Please be sure to bring in all your medications bottles to every appointment.

## 2021-01-24 LAB — T3, FREE: T3, Free: 3 pg/mL (ref 2.0–4.4)

## 2021-01-25 ENCOUNTER — Encounter (HOSPITAL_COMMUNITY)
Admission: RE | Disposition: A | Discharge status: Home / Self Care | Payer: Self-pay | Source: Home / Self Care | Attending: Internal Medicine

## 2021-01-25 ENCOUNTER — Encounter (HOSPITAL_COMMUNITY): Payer: Self-pay | Admitting: Internal Medicine

## 2021-01-25 ENCOUNTER — Ambulatory Visit (HOSPITAL_COMMUNITY)
Admission: RE | Admit: 2021-01-25 | Discharge: 2021-01-25 | Disposition: A | Discharge status: Home / Self Care | Payer: 59 | Attending: Internal Medicine | Admitting: Internal Medicine

## 2021-01-25 ENCOUNTER — Other Ambulatory Visit: Payer: Self-pay

## 2021-01-25 DIAGNOSIS — I509 Heart failure, unspecified: Secondary | ICD-10-CM

## 2021-01-25 DIAGNOSIS — Z79899 Other long term (current) drug therapy: Secondary | ICD-10-CM | POA: Diagnosis not present

## 2021-01-25 DIAGNOSIS — I5023 Acute on chronic systolic (congestive) heart failure: Secondary | ICD-10-CM | POA: Insufficient documentation

## 2021-01-25 DIAGNOSIS — Z87891 Personal history of nicotine dependence: Secondary | ICD-10-CM | POA: Diagnosis not present

## 2021-01-25 DIAGNOSIS — I5022 Chronic systolic (congestive) heart failure: Secondary | ICD-10-CM

## 2021-01-25 DIAGNOSIS — Z6833 Body mass index (BMI) 33.0-33.9, adult: Secondary | ICD-10-CM | POA: Diagnosis not present

## 2021-01-25 DIAGNOSIS — Z7984 Long term (current) use of oral hypoglycemic drugs: Secondary | ICD-10-CM | POA: Insufficient documentation

## 2021-01-25 DIAGNOSIS — E669 Obesity, unspecified: Secondary | ICD-10-CM | POA: Insufficient documentation

## 2021-01-25 DIAGNOSIS — I110 Hypertensive heart disease with heart failure: Secondary | ICD-10-CM | POA: Diagnosis present

## 2021-01-25 HISTORY — PX: RIGHT HEART CATH: CATH118263

## 2021-01-25 LAB — POCT I-STAT EG7
Acid-Base Excess: 8 mmol/L — ABNORMAL HIGH (ref 0.0–2.0)
Acid-Base Excess: 9 mmol/L — ABNORMAL HIGH (ref 0.0–2.0)
Acid-Base Excess: 9 mmol/L — ABNORMAL HIGH (ref 0.0–2.0)
Bicarbonate: 33.6 mmol/L — ABNORMAL HIGH (ref 20.0–28.0)
Bicarbonate: 33.6 mmol/L — ABNORMAL HIGH (ref 20.0–28.0)
Bicarbonate: 34.2 mmol/L — ABNORMAL HIGH (ref 20.0–28.0)
Calcium, Ion: 0.88 mmol/L — CL (ref 1.15–1.40)
Calcium, Ion: 0.98 mmol/L — ABNORMAL LOW (ref 1.15–1.40)
Calcium, Ion: 1.02 mmol/L — ABNORMAL LOW (ref 1.15–1.40)
HCT: 40 % (ref 39.0–52.0)
HCT: 42 % (ref 39.0–52.0)
HCT: 43 % (ref 39.0–52.0)
Hemoglobin: 13.6 g/dL (ref 13.0–17.0)
Hemoglobin: 14.3 g/dL (ref 13.0–17.0)
Hemoglobin: 14.6 g/dL (ref 13.0–17.0)
O2 Saturation: 78 %
O2 Saturation: 80 %
O2 Saturation: 82 %
Potassium: 2.4 mmol/L — CL (ref 3.5–5.1)
Potassium: 2.6 mmol/L — CL (ref 3.5–5.1)
Potassium: 2.7 mmol/L — CL (ref 3.5–5.1)
Sodium: 135 mmol/L (ref 135–145)
Sodium: 135 mmol/L (ref 135–145)
Sodium: 138 mmol/L (ref 135–145)
TCO2: 35 mmol/L — ABNORMAL HIGH (ref 22–32)
TCO2: 35 mmol/L — ABNORMAL HIGH (ref 22–32)
TCO2: 36 mmol/L — ABNORMAL HIGH (ref 22–32)
pCO2, Ven: 46.1 mmHg (ref 44.0–60.0)
pCO2, Ven: 46.1 mmHg (ref 44.0–60.0)
pCO2, Ven: 48.4 mmHg (ref 44.0–60.0)
pH, Ven: 7.449 — ABNORMAL HIGH (ref 7.250–7.430)
pH, Ven: 7.471 — ABNORMAL HIGH (ref 7.250–7.430)
pH, Ven: 7.478 — ABNORMAL HIGH (ref 7.250–7.430)
pO2, Ven: 41 mmHg (ref 32.0–45.0)
pO2, Ven: 42 mmHg (ref 32.0–45.0)
pO2, Ven: 44 mmHg (ref 32.0–45.0)

## 2021-01-25 LAB — BASIC METABOLIC PANEL
Anion gap: 16 — ABNORMAL HIGH (ref 5–15)
BUN: 13 mg/dL (ref 6–20)
CO2: 27 mmol/L (ref 22–32)
Calcium: 8.4 mg/dL — ABNORMAL LOW (ref 8.9–10.3)
Chloride: 89 mmol/L — ABNORMAL LOW (ref 98–111)
Creatinine, Ser: 1.13 mg/dL (ref 0.61–1.24)
GFR, Estimated: >60 mL/min (ref >60)
Glucose, Bld: 94 mg/dL (ref 70–99)
Potassium: 2.8 mmol/L — ABNORMAL LOW (ref 3.5–5.1)
Sodium: 132 mmol/L — ABNORMAL LOW (ref 135–145)

## 2021-01-25 SURGERY — RIGHT HEART CATH
Anesthesia: LOCAL

## 2021-01-25 MED ORDER — MIDAZOLAM HCL 2 MG/2ML IJ SOLN
INTRAMUSCULAR | Status: AC
  Filled 2021-01-25: qty 2

## 2021-01-25 MED ORDER — ACETAMINOPHEN 325 MG PO TABS: 650.0000 mg | ORAL_TABLET | ORAL | Status: DC | PRN

## 2021-01-25 MED ORDER — ONDANSETRON HCL 4 MG/2ML IJ SOLN: 4.0000 mg | Freq: Four times a day (QID) | INTRAMUSCULAR | Status: DC | PRN

## 2021-01-25 MED ORDER — FENTANYL CITRATE (PF) 100 MCG/2ML IJ SOLN
INTRAMUSCULAR | Status: AC
  Filled 2021-01-25: qty 2

## 2021-01-25 MED ORDER — MIDAZOLAM HCL 2 MG/2ML IJ SOLN
INTRAMUSCULAR | Status: DC | PRN
  Administered 2021-01-25: 1 mg via INTRAVENOUS

## 2021-01-25 MED ORDER — POTASSIUM CHLORIDE CRYS ER 20 MEQ PO TBCR: 60.0000 meq | EXTENDED_RELEASE_TABLET | Freq: Once | ORAL | Status: DC

## 2021-01-25 MED ORDER — SODIUM CHLORIDE 0.9% FLUSH: 3.0000 mL | Freq: Two times a day (BID) | INTRAVENOUS | Status: DC

## 2021-01-25 MED ORDER — SODIUM CHLORIDE 0.9 % IV SOLN: 250.0000 mL | INTRAVENOUS | Status: DC | PRN

## 2021-01-25 MED ORDER — HEPARIN (PORCINE) IN NACL 1000-0.9 UT/500ML-% IV SOLN
INTRAVENOUS | Status: DC | PRN
  Administered 2021-01-25: 500 mL

## 2021-01-25 MED ORDER — LIDOCAINE HCL (PF) 1 % IJ SOLN
INTRAMUSCULAR | Status: AC
  Filled 2021-01-25: qty 30

## 2021-01-25 MED ORDER — SODIUM CHLORIDE 0.9% FLUSH: 3.0000 mL | INTRAVENOUS | Status: DC | PRN

## 2021-01-25 MED ORDER — ASPIRIN 81 MG PO CHEW
81.0000 mg | CHEWABLE_TABLET | ORAL | Status: AC
  Administered 2021-01-25: 81 mg via ORAL
  Filled 2021-01-25: qty 1

## 2021-01-25 MED ORDER — LABETALOL HCL 5 MG/ML IV SOLN: 10.0000 mg | INTRAVENOUS | Status: DC | PRN

## 2021-01-25 MED ORDER — LIDOCAINE HCL (PF) 1 % IJ SOLN
INTRAMUSCULAR | Status: DC | PRN
  Administered 2021-01-25: 2 mL

## 2021-01-25 MED ORDER — FENTANYL CITRATE (PF) 100 MCG/2ML IJ SOLN
INTRAMUSCULAR | Status: DC | PRN
  Administered 2021-01-25: 25 ug via INTRAVENOUS

## 2021-01-25 MED ORDER — HEPARIN (PORCINE) IN NACL 1000-0.9 UT/500ML-% IV SOLN
INTRAVENOUS | Status: AC
  Filled 2021-01-25: qty 500

## 2021-01-25 MED ORDER — HYDRALAZINE HCL 20 MG/ML IJ SOLN: 10.0000 mg | INTRAMUSCULAR | Status: DC | PRN

## 2021-01-25 MED ORDER — SODIUM CHLORIDE 0.9 % IV SOLN: INTRAVENOUS | Status: DC

## 2021-01-25 SURGICAL SUPPLY — 12 items
CATH SWAN GANZ 7F STRAIGHT (CATHETERS) ×2 IMPLANT
GLIDESHEATH SLEND SS 6F .021 (SHEATH) IMPLANT
GLIDESHEATH SLENDER 7FR .021G (SHEATH) ×2 IMPLANT
GUIDEWIRE INQWIRE 1.5J.035X260 (WIRE) IMPLANT
INQWIRE 1.5J .035X260CM (WIRE)
PACK CARDIAC CATHETERIZATION (CUSTOM PROCEDURE TRAY) ×2 IMPLANT
PROTECTION STATION PRESSURIZED (MISCELLANEOUS) ×2
SHEATH GLIDE SLENDER 4/5FR (SHEATH) ×2 IMPLANT
STATION PROTECTION PRESSURIZED (MISCELLANEOUS) ×1 IMPLANT
TRANSDUCER W/STOPCOCK (MISCELLANEOUS) ×2 IMPLANT
TUBING ART PRESS 72  MALE/FEM (TUBING) ×1
TUBING ART PRESS 72 MALE/FEM (TUBING) ×1 IMPLANT

## 2021-01-25 NOTE — Discharge Instructions (Signed)
Venogram A venogram, or venography, is a procedure that uses an X-ray and dye (contrast) to examine how well the veins work and how blood flows through them. Contrast helps the veins show up on X-rays. A venogram may be done:  To evaluate abnormalities in the vein.  To identify clots within veins, such as deep vein thrombosis (DVT).  To map out the veins that might be needed for another procedure. Tell a health care provider about:  Any allergies you have, especially to medicines, shellfish, iodine, and contrast.  All medicines you are taking, including vitamins, herbs, eye drops, creams, and over-the-counter medicines.  Any problems you or family members have had with anesthetic medicines.  Any blood disorders you have.  Any surgeries you have had and any complications that occurred.  Any medical conditions you have.  Whether you are pregnant, may be pregnant, or are breastfeeding.  Any history of smoking or tobacco use. What are the risks? Generally, this is a safe procedure. However, problems may occur, including:  Infection.  Bleeding.  Blood clots.  Allergic reaction to medicines or contrast.  Damage to other structures or organs.  Kidney problems.  Increased risk of cancer. Being exposed to too much radiation over a lifetime can increase the risk of cancer. The risk is small. What happens before the procedure? Medicines Ask your health care provider about:  Changing or stopping your regular medicines. This is especially important if you are taking diabetes medicines or blood thinners.  Taking medicines such as aspirin and ibuprofen. These medicines can thin your blood. Do not take these medicines unless your health care provider tells you to take them.  Taking over-the-counter medicines, vitamins, herbs, and supplements. General instructions  Follow instructions from your health care provider about eating or drinking restrictions.  You may have blood tests  to check how well your kidneys and liver are working and how well your blood can clot.  Plan to have someone take you home from the hospital or clinic. What happens during the procedure?  An IV will be inserted into one of your veins.  You may be given a medicine to help you relax (sedative).  You will lie down on an X-ray table. The table may be tilted in different directions during the procedure to help the contrast move throughout your body. Safety straps will keep you secure if the table is tilted.  If veins in your arm or leg will be examined, a band may be wrapped around that arm or leg to keep the veins full of blood. This may cause your arm or leg to feel numb.  The contrast will be injected into your IV. You may have a hot, flushed feeling as it moves throughout your body. You may also have a metallic taste in your mouth. Both of these sensations will go away after the test is complete.  You may be asked to lie in different positions or place your legs or arms in different positions.  At the end of the procedure, you may be given IV fluids to help wash or flush the contrast out of your veins.  The IV will be removed, and pressure will be applied to the IV site to prevent bleeding. A bandage (dressing) may be applied to the IV site. The exact procedure may vary among health care providers and hospitals.   What can I expect after the procedure?  Your blood pressure, heart rate, breathing rate, and blood oxygen level will be monitored until   you leave the hospital or clinic.  You may be given something to eat and drink.  You may have bruising or mild discomfort in the area where the IV was inserted. Follow these instructions at home: Eating and drinking  Follow instructions from your health care provider about eating or drinking restrictions.  Drink a lot of water for the first several days after the procedure, as directed by your health care provider. This helps to flush the  contrast out of your body.   Activity  Rest as told by your health care provider.  Return to your normal activities as told by your health care provider. Ask your health care provider what activities are safe for you.  If you were given a sedative during your procedure, do not drive for 24 hours or until your health care provider approves. General instructions  Check your IV insertion area every day for signs of infection. Check for: ? Redness, swelling, or pain. ? Fluid or blood. ? Warmth. ? Pus or a bad smell.  Take over-the-counter and prescription medicines only as told by your health care provider.  Keep all follow-up visits as told by your health care provider. This is important. Contact a health care provider if:  Your skin becomes itchy or you develop a rash or hives.  You have a fever that does not get better with medicine.  You feel nauseous or you vomit.  You have redness, swelling, or pain around the insertion site.  You have fluid or blood coming from the insertion site.  Your insertion area feels warm to the touch.  You have pus or a bad smell coming from the insertion site. Get help right away if you:  Have shortness of breath or difficulty breathing.  Develop chest pain.  Faint.  Feel very dizzy. These symptoms may represent a serious problem that is an emergency. Do not wait to see if the symptoms will go away. Get medical help right away. Call your local emergency services (911 in the [place] Do not drive yourself to the hospital. Summary  A venogram, or venography, is a procedure that uses an X-ray and contrast dye to check how well the veins work and how blood flows through them.  An IV will be inserted into one of your veins in order to inject the contrast.  During the exam, you will lie on an X-ray table. The table may be tilted in different directions during the procedure to help the contrast move throughout your body. Safety straps will keep  you secure.  After the procedure, you will need to drink a lot of water to help wash or flush the contrast out of your body. This information is not intended to replace advice given to you by your health care provider. Make sure you discuss any questions you have with your health care provider. Document Revised: 07/23/2019 Document Reviewed: 07/23/2019 Elsevier Patient Education  2021 Elsevier Inc.  

## 2021-01-25 NOTE — Interval H&P Note (Signed)
History and Physical Interval Note:  01/25/2021 9:22 AM  Brandon Huber  has presented today for surgery, with the diagnosis of heart failure.  The various methods of treatment have been discussed with the patient and family. After consideration of risks, benefits and other options for treatment, the patient has consented to  Procedure(s): RIGHT HEART CATH (N/A) as a surgical intervention.  The patient's history has been reviewed, patient examined, no change in status, stable for surgery.  I have reviewed the patient's chart and labs.  Questions were answered to the patient's satisfaction.     [name]

## 2021-01-25 NOTE — Progress Notes (Signed)
Pt ambulated without difficulty or bleeding.   Discharged home with mom who will drive and stay with pt x 24 hrs  °

## 2021-02-01 ENCOUNTER — Telehealth (HOSPITAL_COMMUNITY): Payer: Self-pay

## 2021-02-01 NOTE — Telephone Encounter (Signed)
-----   Message from Modesta Messing, [place] sent at 01/25/2021  1:29 PM EST -----  ----- Message ----- From: Dolores Patty, MD Sent: 01/23/2021   7:01 PM EST To: Noralee Space, RN  Please have him take kcl x 1. Have him restart his K at home as well as he has not been taking regualrly.

## 2021-02-01 NOTE — Telephone Encounter (Signed)
Spoke with patient and aware.

## 2021-02-22 ENCOUNTER — Encounter (HOSPITAL_COMMUNITY): Payer: 59 | Admitting: Internal Medicine

## 2021-02-24 NOTE — Progress Notes (Signed)
Advanced Heart Failure Clinic Note   Referring Physician: PCP: Default, Provider, MD PCP-Cardiologist: No primary care provider on file.   HPI:  Mr Heinbaugh is a 30 yo with morbid obesity,severe HTN and tobacco abuse. He presented to [facility] on 7/20 with acute HF. Echocardiogram  EF < 25%, severely dilated LV 1-2+ MR. Left heart catheterization revealed no significant CAD. RHC with wedge of 20, Fick CO/CI 4.02/1.7.  He underwent cardiac MRI that showed no signs of infiltration but severely dilated LV and RV.   He was seen for the first time in HF Clinic on 09/28/19. He presented w/ NYHA IIIB-IV symptoms and marked volume overload in the setting of medication non compliance. He was directed admitted to hospital from clinic. Echo EF <20%.   Echo 11/21 EF 25%  We saw him on 01/23/21 and was pale, diaphoretic and tachycardic concerning for low output. RHC arranged 01/25/21 and patient looked much better on arrival with HR in 80-90s. Hemodynamics well compensated  He is here for f/u. Says he feels alright for the most part.  Breathing has been doing well, he gets more symptomatic when he has insomnia and hasn't had insomnia recently though.  He walked from the parking lot to here without stopping to take a rest, if he has to walk around the neighborhood or longer distances he gets dyspneic and has to slow down.  Does notice his heart rate goes up starts sweating a lot.  Weighing himself daily, staying 250-257.  Ran out of torsemide last dose was on Saturday otherwise taking all meds. Has not been able to call back and schedule sleep study yet.  Had covid on January 3rd but had minimal symptoms.    RHC 01/25/21 RA = 9 RV = 32/12 PA =  32/14 (23) PCW = 16 Fick cardiac output/index = 9.6/4.1 Thermo CO/CI = 7.4/3.1 PVR = 1.0 WU Ao sat = 97% PA sat = 81%, 82% SVC sat = 78%    Cardiac studies:  Echo 09/28/19 EF 15%. Mild RV dysfunction Echo 01/04/20 EF 20-25% RV ok  Echo 11/21 EF 25% cMRI 7/20  ([facility]): Markedly dilated left ventricle, dilated bilateral atria, global hypokinesis and reduced ejection fraction. Mild to moderate mitral valve regurgitation.  ZIO 10/21: SR average HR 111, rare PVC/PACs, no high-grade arrhythmias  Current Outpatient Medications  Medication Sig Dispense Refill  . carvedilol (COREG) 12.5 MG tablet Take 1 tablet (12.5 mg total) by mouth 2 (two) times daily with a meal. 60 tablet 6  . dapagliflozin propanediol (FARXIGA) 10 MG TABS tablet Take 10 mg by mouth daily before breakfast. 30 tablet 11  . digoxin (LANOXIN) 0.125 MG tablet Take 1 tablet (125 mcg total) by mouth daily. 90 tablet 2  . ivabradine (CORLANOR) 7.5 MG TABS tablet Take 1 tablet (7.5 mg total) by mouth 2 (two) times daily with a meal. 30 tablet 3  . potassium chloride SA (KLOR-CON) 20 MEQ tablet Take 80 mEq by mouth daily.    . sacubitril-valsartan (ENTRESTO) 97-103 MG Take 1 tablet by mouth 2 (two) times daily.    Marland Kitchen spironolactone (ALDACTONE) 25 MG tablet TAKE 1 TABLET BY MOUTH EVERY DAY 90 tablet 1  . torsemide (DEMADEX) 20 MG tablet TAKE 2 TABLETS (40 MG) BY MOUTH DAILY 180 tablet 3   No current facility-administered medications for this encounter.    No Known Allergies    Social History   Socioeconomic History  . Marital status: Unknown    Spouse name: Not on file  .  Number of children: Not on file  . Years of education: Not on file  . Highest education level: Not on file  Occupational History  . Not on file  Tobacco Use  . Smoking status: Never Smoker  . Smokeless tobacco: Never Used  Substance and Sexual Activity  . Alcohol use: Never  . Drug use: Not on file  . Sexual activity: Not on file  Other Topics Concern  . Not on file  Social History Narrative  . Not on file   Social Determinants of Health   Financial Resource Strain: Low Risk   . Difficulty of Paying Living Expenses: Not very hard  Food Insecurity: No Food Insecurity  . Worried About Programme researcher, broadcasting/film/video  in the Last Year: Never true  . Ran Out of Food in the Last Year: Never true  Transportation Needs: No Transportation Needs  . Lack of Transportation (Medical): No  . Lack of Transportation (Non-Medical): No  Physical Activity: Insufficiently Active  . Days of Exercise per Week: 5 days  . Minutes of Exercise per Session: 20 min  Stress: No Stress Concern Present  . Feeling of Stress : Not at all  Social Connections: Not on file  Intimate Partner Violence: Not on file    Vitals:   02/25/21 0927  BP: 104/70  Pulse: (!) 109  SpO2: 99%  Weight: 115.4 kg (254 lb 6.4 oz)   Wt Readings from Last 3 Encounters:  02/25/21 115.4 kg (254 lb 6.4 oz)  01/25/21 112.5 kg (248 lb)  01/23/21 112.9 kg (249 lb)   Body mass index is 34.5 kg/m.  PHYSICAL EXAM: General: Obese male. No resp difficulty HEENT: normal Neck: supple. no JVD. Carotids 2+ bilat; no bruits. No lymphadenopathy or thryomegaly appreciated. Cor: PMI nondisplaced. Regular rate & rhythm. No rubs, gallops or murmurs. Lungs: clear Abdomen: obese soft, nontender, nondistended. No hepatosplenomegaly. No bruits or masses. Good bowel sounds. Extremities: no cyanosis, clubbing, rash, edema Neuro: alert & orientedx3, cranial nerves grossly intact. moves all 4 extremities w/o difficulty. Affect pleasant    ECG: SR 92 bpm no PVC's Personally reviewed  ASSESSMENT & PLAN:  1. Acute on chronic systolic HF:   - Diagnosed  06/2019 at [facility]. LVEF at 20% w/ biventricular failure. LHC 06/2019 showed normal coronaries. RHC showed mean wedge of 20, FICK CO/CI 4.02/1.7. cMRI showed no signs of infiltration but severely dilated LV and RV.  - Echo at [facility] 09/29/19 showed EF at 15% no LVH, has mildly reduced RV systolic dysfunction w/ normal PA systolic pressure. Mild MR.  - ? Etiology. ?OSA, hypertensive heart disease vs familial (father w/ CHF) - Echo 01/04/20  EF 20-25% - Echo 11/21 EF <20%, RV moderate to severely reduced - Zio  monitor 10/21 showed SR average HR 111, no high-grade arrhythmias, rare PACs/PVs - RHC 01/25/21 well compensated hemodynamics (much better than expected) - Improving slowly. NYHA II Volume status ok - Continue Corlanor 7.5mg  BID - Continue torsemide 40  - ContinueEntresto 97/103  Bid  - Continue spironolactone 25 mg daily.  - Continue Digoxin 0.125 - Continue Farxiga 10 daily - Increase carvedilol to 18.75mg  BID - Plan CPX to objectively assess functional capacity - See back in 3 months with ECHO. If EF not > 35% is agreeable to ICD. - Blood type AB+. Would be a good transplant candidate if HF does not improve w/ medical management.  - Labs today   2. HTN: - BP ok   3. Obesity: -  Has lost 50 pounds. Body mass index is 34.5 kg/m. - Had sleep study in 2/21 with Dr. Mayford Knife  which was non-diagnostic due to no sleep. Will need in-lab studay.  - We discussed that he will need to call and schedule his sleep study this is already ordered  4. Tobacco Abuse:  - remains quit. No change   Arvilla Meres, MD  11:31 AM

## 2021-02-25 ENCOUNTER — Other Ambulatory Visit: Payer: Self-pay

## 2021-02-25 ENCOUNTER — Encounter (HOSPITAL_COMMUNITY): Payer: Self-pay | Admitting: Internal Medicine

## 2021-02-25 ENCOUNTER — Ambulatory Visit (HOSPITAL_COMMUNITY)
Admission: RE | Admit: 2021-02-25 | Discharge: 2021-02-25 | Disposition: A | Discharge status: Home / Self Care | Payer: 59 | Source: Ambulatory Visit | Attending: Internal Medicine | Admitting: Internal Medicine

## 2021-02-25 VITALS — BP 104/70 | HR 109 | Wt 254.4 lb

## 2021-02-25 DIAGNOSIS — I340 Nonrheumatic mitral (valve) insufficiency: Secondary | ICD-10-CM | POA: Insufficient documentation

## 2021-02-25 DIAGNOSIS — R000 Tachycardia, unspecified: Secondary | ICD-10-CM | POA: Diagnosis not present

## 2021-02-25 DIAGNOSIS — I5022 Chronic systolic (congestive) heart failure: Secondary | ICD-10-CM | POA: Diagnosis present

## 2021-02-25 DIAGNOSIS — Z6834 Body mass index (BMI) 34.0-34.9, adult: Secondary | ICD-10-CM | POA: Diagnosis not present

## 2021-02-25 DIAGNOSIS — Z9114 Patient's other noncompliance with medication regimen: Secondary | ICD-10-CM | POA: Diagnosis not present

## 2021-02-25 DIAGNOSIS — I10 Essential (primary) hypertension: Secondary | ICD-10-CM

## 2021-02-25 DIAGNOSIS — I447 Left bundle-branch block, unspecified: Secondary | ICD-10-CM | POA: Insufficient documentation

## 2021-02-25 DIAGNOSIS — I110 Hypertensive heart disease with heart failure: Secondary | ICD-10-CM | POA: Insufficient documentation

## 2021-02-25 DIAGNOSIS — I5023 Acute on chronic systolic (congestive) heart failure: Secondary | ICD-10-CM

## 2021-02-25 LAB — BASIC METABOLIC PANEL
Anion gap: 9 (ref 5–15)
BUN: 8 mg/dL (ref 6–20)
CO2: 28 mmol/L (ref 22–32)
Calcium: 9.3 mg/dL (ref 8.9–10.3)
Chloride: 100 mmol/L (ref 98–111)
Creatinine, Ser: 1.19 mg/dL (ref 0.61–1.24)
GFR, Estimated: >60 mL/min (ref >60)
Glucose, Bld: 109 mg/dL — ABNORMAL HIGH (ref 70–99)
Potassium: 4 mmol/L (ref 3.5–5.1)
Sodium: 137 mmol/L (ref 135–145)

## 2021-02-25 LAB — DIGOXIN LEVEL: Digoxin Level: <0.2 ng/mL — ABNORMAL LOW (ref 0.8–2.0)

## 2021-02-25 LAB — BRAIN NATRIURETIC PEPTIDE: B Natriuretic Peptide: 2059.1 pg/mL — ABNORMAL HIGH (ref 0.0–100.0)

## 2021-02-25 MED ORDER — TORSEMIDE 20 MG PO TABS: ORAL_TABLET | ORAL | 3 refills | Status: DC

## 2021-02-25 MED ORDER — CARVEDILOL 12.5 MG PO TABS: 18.7500 mg | ORAL_TABLET | Freq: Two times a day (BID) | ORAL | 6 refills | Status: DC

## 2021-02-25 NOTE — Patient Instructions (Signed)
INCREASE Carvedilol to 18.75mg  (1.5 tabs) twice a day  Your physician has recommended that you have a cardiopulmonary stress test (CPX). CPX testing is a non-invasive measurement of heart and lung function. It replaces a traditional treadmill stress test. This type of test provides a tremendous amount of information that relates not only to your present condition but also for future outcomes. This test combines measurements of you ventilation, respiratory gas exchange in the lungs, electrocardiogram (EKG), blood pressure and physical response before, during, and following an exercise protocol.   Your physician has requested that you have an echocardiogram. Echocardiography is a painless test that uses sound waves to create images of your heart. It provides your doctor with information about the size and shape of your heart and how well your heart's chambers and valves are working. This procedure takes approximately one hour. There are no restrictions for this procedure.  Your physician recommends that you schedule a follow-up appointment in: 3 months with Dr Gala Romney and an echo   Please call office at [phone] option 2 if you have any questions or concerns.   At the Advanced Heart Failure Clinic, you and your health needs are our priority. As part of our continuing mission to provide you with exceptional heart care, we have created designated Provider Care Teams. These Care Teams include your primary Cardiologist (physician) and Advanced Practice Providers (APPs- Physician Assistants and Nurse Practitioners) who all work together to provide you with the care you need, when you need it.   You may see any of the following providers on your designated Care Team at your next follow up: Marland Kitchen Dr Arvilla Meres . Dr Marca Ancona . Dr Thornell Mule . Tonye Becket, NP . Robbie Lis, PA . Shanda Bumps [place],NP . Karle Plumber, PharmD   Please be sure to bring in all your medications bottles to  every appointment.

## 2021-02-25 NOTE — Addendum Note (Signed)
Encounter addended by: Dolores Patty, MD on: 02/25/2021 11:31 AM  Actions taken: Level of Service modified, Visit diagnoses modified

## 2021-03-26 ENCOUNTER — Encounter (HOSPITAL_COMMUNITY): Payer: 59

## 2021-03-28 ENCOUNTER — Encounter (HOSPITAL_COMMUNITY): Payer: 59

## 2021-04-15 ENCOUNTER — Other Ambulatory Visit: Payer: Self-pay

## 2021-04-15 ENCOUNTER — Ambulatory Visit (HOSPITAL_COMMUNITY): Payer: 59 | Attending: Internal Medicine

## 2021-04-15 DIAGNOSIS — I5022 Chronic systolic (congestive) heart failure: Secondary | ICD-10-CM | POA: Diagnosis present

## 2021-05-29 ENCOUNTER — Ambulatory Visit (HOSPITAL_COMMUNITY): Admission: RE | Admit: 2021-05-29 | Payer: 59 | Source: Ambulatory Visit

## 2021-05-29 ENCOUNTER — Encounter (HOSPITAL_COMMUNITY): Payer: 59 | Admitting: Internal Medicine

## 2021-06-13 ENCOUNTER — Ambulatory Visit (HOSPITAL_BASED_OUTPATIENT_CLINIC_OR_DEPARTMENT_OTHER): Payer: 59 | Attending: Internal Medicine | Admitting: Cardiology

## 2021-08-21 ENCOUNTER — Encounter (HOSPITAL_COMMUNITY): Payer: Self-pay | Admitting: Internal Medicine

## 2021-08-21 ENCOUNTER — Ambulatory Visit (HOSPITAL_BASED_OUTPATIENT_CLINIC_OR_DEPARTMENT_OTHER)
Admission: RE | Admit: 2021-08-21 | Discharge: 2021-08-21 | Disposition: A | Discharge status: Home / Self Care | Payer: 59 | Source: Ambulatory Visit | Attending: Internal Medicine | Admitting: Internal Medicine

## 2021-08-21 ENCOUNTER — Ambulatory Visit (HOSPITAL_COMMUNITY)
Admission: RE | Admit: 2021-08-21 | Discharge: 2021-08-21 | Disposition: A | Discharge status: Home / Self Care | Payer: 59 | Source: Ambulatory Visit | Attending: Internal Medicine | Admitting: Internal Medicine

## 2021-08-21 ENCOUNTER — Other Ambulatory Visit: Payer: Self-pay

## 2021-08-21 VITALS — BP 120/80 | HR 113 | Wt 254.2 lb

## 2021-08-21 DIAGNOSIS — Z7984 Long term (current) use of oral hypoglycemic drugs: Secondary | ICD-10-CM | POA: Insufficient documentation

## 2021-08-21 DIAGNOSIS — Z7901 Long term (current) use of anticoagulants: Secondary | ICD-10-CM | POA: Diagnosis not present

## 2021-08-21 DIAGNOSIS — I5022 Chronic systolic (congestive) heart failure: Secondary | ICD-10-CM | POA: Insufficient documentation

## 2021-08-21 DIAGNOSIS — Z79899 Other long term (current) drug therapy: Secondary | ICD-10-CM | POA: Diagnosis not present

## 2021-08-21 DIAGNOSIS — I081 Rheumatic disorders of both mitral and tricuspid valves: Secondary | ICD-10-CM | POA: Insufficient documentation

## 2021-08-21 DIAGNOSIS — Z6834 Body mass index (BMI) 34.0-34.9, adult: Secondary | ICD-10-CM | POA: Diagnosis not present

## 2021-08-21 DIAGNOSIS — I10 Essential (primary) hypertension: Secondary | ICD-10-CM

## 2021-08-21 DIAGNOSIS — F17210 Nicotine dependence, cigarettes, uncomplicated: Secondary | ICD-10-CM | POA: Insufficient documentation

## 2021-08-21 DIAGNOSIS — I110 Hypertensive heart disease with heart failure: Secondary | ICD-10-CM | POA: Diagnosis not present

## 2021-08-21 DIAGNOSIS — I5082 Biventricular heart failure: Secondary | ICD-10-CM | POA: Diagnosis not present

## 2021-08-21 DIAGNOSIS — I5023 Acute on chronic systolic (congestive) heart failure: Secondary | ICD-10-CM

## 2021-08-21 DIAGNOSIS — R000 Tachycardia, unspecified: Secondary | ICD-10-CM

## 2021-08-21 LAB — CBC
HCT: 37.3 % — ABNORMAL LOW (ref 39.0–52.0)
Hemoglobin: 12 g/dL — ABNORMAL LOW (ref 13.0–17.0)
MCH: 26.9 pg (ref 26.0–34.0)
MCHC: 32.2 g/dL (ref 30.0–36.0)
MCV: 83.6 fL (ref 80.0–100.0)
Platelets: 179 10*3/uL (ref 150–400)
RBC: 4.46 MIL/uL (ref 4.22–5.81)
RDW: 17.2 % — ABNORMAL HIGH (ref 11.5–15.5)
WBC: 7.9 10*3/uL (ref 4.0–10.5)
nRBC: 0 % (ref 0.0–0.2)

## 2021-08-21 LAB — ECHOCARDIOGRAM COMPLETE
Area-P 1/2: 7.25 cm2
Calc EF: 14.2 %
MV M vel: 4.31 m/s
MV Peak grad: 74.3 mmHg
MV VTI: 1.68 cm2
Radius: 0.7 cm
S' Lateral: 6.9 cm
Single Plane A2C EF: 13.2 %
Single Plane A4C EF: 13.5 %

## 2021-08-21 LAB — DIGOXIN LEVEL: Digoxin Level: <0.2 ng/mL — ABNORMAL LOW (ref 0.8–2.0)

## 2021-08-21 MED ORDER — IVABRADINE HCL 5 MG PO TABS
5.0000 mg | ORAL_TABLET | Freq: Two times a day (BID) | ORAL | 6 refills | Status: DC
Start: 2021-08-21 — End: 2022-08-26

## 2021-08-21 NOTE — Progress Notes (Signed)
Advanced Heart Failure Clinic Note   Referring Physician: PCP: Default, Provider, MD PCP-Cardiologist: Dr. Gala Romney  HPI:  Brandon Huber is a 30 yo with morbid obesity, severe HTN and tobacco abuse. He presented to [facility] on 7/20 with acute HF. Echocardiogram  EF < 25%, severely dilated LV 1-2+ Brandon. Left heart catheterization revealed no significant CAD. RHC with wedge of 20, Fick CO/CI 4.02/1.7.  He underwent cardiac MRI that showed no signs of infiltration but severely dilated LV and RV.    He was seen for the first time in HF Clinic on 09/28/19. He presented w/ NYHA IIIB-IV symptoms and marked volume overload in the setting of medication non compliance. He was directed admitted to hospital from clinic. Echo EF <20%.   Echo 11/21 EF 25%   We saw him on 01/23/21 and was pale, diaphoretic and tachycardic concerning for low output. RHC arranged 01/25/21 and patient looked much better on arrival with HR in 80-90s. Hemodynamics well compensated.   Last seen for follow-up in February. Appeared compensated. Weight 250-257 lb.  He is here today for follow-up with echo. He is here with his mom. Says he is feeling better. Able to all ADLs and walk without any problem. Occasional chest pressure and congestion which resolves with extra lasix. Edema generally well controlled. No orthopnea or PND.    Cardiac studies: CPX 04/22 submaximal effort, available data suggests severe functional impairment d/t heart failure with severely elevated RK/YH06 slope, mildly blunted BP response, desaturations down to 77% with exercise  RHC 01/25/21 RA = 9 RV = 32/12 PA =  32/14 (23) PCW = 16 Fick cardiac output/index = 9.6/4.1 Thermo CO/CI = 7.4/3.1 PVR = 1.0 WU Ao sat = 97% PA sat = 81%, 82% SVC sat = 78%  Echo 09/28/19 EF 15%. Mild RV dysfunction Echo 01/04/20 EF 20-25% RV ok  Echo 11/21 EF 25%   cMRI 7/20 ([facility]): Markedly dilated left ventricle, dilated bilateral atria, global hypokinesis and reduced  ejection fraction. Mild to moderate mitral valve regurgitation.   ZIO 10/21: SR average HR 111, rare PVC/PACs, no high-grade arrhythmias    Current Outpatient Medications  Medication Sig Dispense Refill   carvedilol (COREG) 12.5 MG tablet Take 1.5 tablets (18.75 mg total) by mouth 2 (two) times daily with a meal. 90 tablet 6   dapagliflozin propanediol (FARXIGA) 10 MG TABS tablet Take 10 mg by mouth daily before breakfast. 30 tablet 11   digoxin (LANOXIN) 0.125 MG tablet Take 1 tablet (125 mcg total) by mouth daily. 90 tablet 2   potassium chloride SA (KLOR-CON) 20 MEQ tablet Take 80 mEq by mouth daily.     sacubitril-valsartan (ENTRESTO) 97-103 MG Take 1 tablet by mouth 2 (two) times daily.     spironolactone (ALDACTONE) 25 MG tablet TAKE 1 TABLET BY MOUTH EVERY DAY 90 tablet 1   torsemide (DEMADEX) 20 MG tablet TAKE 2 TABLETS (40 MG) BY MOUTH DAILY 180 tablet 3   ivabradine (CORLANOR) 7.5 MG TABS tablet Take 1 tablet (7.5 mg total) by mouth 2 (two) times daily with a meal. (Patient not taking: Reported on 08/21/2021) 30 tablet 3   No current facility-administered medications for this encounter.    No Known Allergies    Social History   Socioeconomic History   Marital status: Unknown    Spouse name: Not on file   Number of children: Not on file   Years of education: Not on file   Highest education level: Not on file  Occupational History  Not on file  Tobacco Use   Smoking status: Never   Smokeless tobacco: Never  Substance and Sexual Activity   Alcohol use: Never   Drug use: Not on file   Sexual activity: Not on file  Other Topics Concern   Not on file  Social History Narrative   Not on file   Social Determinants of Health   Financial Resource Strain: Not on file  Food Insecurity: Not on file  Transportation Needs: Not on file  Physical Activity: Not on file  Stress: Not on file  Social Connections: Not on file  Intimate Partner Violence: Not on file     Vitals:   08/21/21 1413  BP: 120/80  Pulse: (!) 113  SpO2: 98%  Weight: 115.3 kg (254 lb 3.2 oz)    Wt Readings from Last 3 Encounters:  08/21/21 115.3 kg (254 lb 3.2 oz)  02/25/21 115.4 kg (254 lb 6.4 oz)  01/25/21 112.5 kg (248 lb)   Body mass index is 34.48 kg/m.  PHYSICAL EXAM: General:  Well appearing. No resp difficulty HEENT: normal Neck: supple. JVP 9 with prominent v waves  Carotids 2+ bilat; no bruits. No lymphadenopathy or thryomegaly appreciated. Cor: PMI nondisplaced. Tachy regular No rubs, gallops or murmurs. Lungs: clear Abdomen: obese  soft, nontender, nondistended. No hepatosplenomegaly. No bruits or masses. Good bowel sounds. Extremities: no cyanosis, clubbing, rash, tr-1+ LE edema R>L Neuro: alert & orientedx3, cranial nerves grossly intact. moves all 4 extremities w/o difficulty. Affect pleasant  ECG: Sinus tach 120 No ST-T wave abnormalities. Personally reviewed   ASSESSMENT & PLAN:  1. Acute on chronic systolic HF:   - Diagnosed  06/2019 at [facility]. LVEF at 20% w/ biventricular failure. LHC 06/2019 showed normal coronaries. RHC showed mean wedge of 20, FICK CO/CI 4.02/1.7. cMRI showed no signs of infiltration but severely dilated LV and RV.  - Echo at [facility] 09/29/19 showed EF at 15% no LVH, has mildly reduced RV systolic dysfunction w/ normal PA systolic pressure. Mild Brandon.  - ? Etiology. ?OSA, hypertensive heart disease vs familial (father w/ CHF) - Echo 01/04/20  EF 20-25% - Echo 11/21 EF <20%, RV moderate to severely reduced - Zio monitor 10/21 showed SR average HR 111, no high-grade arrhythmias, rare PACs/PVs - RHC 01/25/21 well compensated hemodynamics (much better than expected) - CPX 04/22 submaximal effort RER 0.95 pVO2  15.0  VeVCO2 43 available data suggests severe functional impairment d/t heart failure - Echo today 08/21/21: EF 15-20% RV mild HK. Severe Brandon/TR. ? Small laminated apical clot Personally reviewed - NYHA II-III. Volume  status ok  - Continue torsemide 40  - Continue Entresto 97/103  Bid  - Continue spironolactone 25 mg daily.  - Continue Digoxin 0.125 - Continue Farxiga 10 daily - Continue carvedilol to 18.75mg  BID  -  Blood type AB+. Would be a good transplant candidate if HF does not improve w/ medical management.   -  I remain very concerned about him. Reports improved NYHA II-early III symptoms despite persistent severe LV dysfunction, marked resting tachycardia and severe HF limitation of CPX. I will restart ivabradine 5 bid and plan repeat CPX in next few weeks. If CPX not improving would favor initiation of transplant/VAD w/u. - We discussed ICD implant and he wants to defer until we decide on advanced therpaties.    2. HTN: - BP ok  3. Obesity:  - Has lost 50 pounds. Body mass index is 34.48 kg/m. - Had sleep study in  2/21 with Dr. Mayford Knife  which was non-diagnostic due to no sleep. Will need in-lab sleep study. He has not scheduled. - Nochange   4. Tobacco Abuse:  - remains quit. No change  5. Possible laminated LV clot  - can consider repeat cMRI. Will review with colleagues. Does not appear high risk for embolization.   Total time spent 45 minutes. Over half that time spent discussing above.    Arvilla Meres, MD  2:39 PM

## 2021-08-21 NOTE — Patient Instructions (Signed)
Start Corlanor 5 mg Twice daily   Labs done today, your results will be available in MyChart, we will contact you for abnormal readings.  Your physician has recommended that you have a cardiopulmonary stress test (CPX). CPX testing is a non-invasive measurement of heart and lung function. It replaces a traditional treadmill stress test. This type of test provides a tremendous amount of information that relates not only to your present condition but also for future outcomes. This test combines measurements of you ventilation, respiratory gas exchange in the lungs, electrocardiogram (EKG), blood pressure and physical response before, during, and following an exercise protocol.  Your physician recommends that you schedule a follow-up appointment in: 6-8 weeks  If you have any questions or concerns before your next appointment please send [place] a message through [place] or call our office at [phone].    TO LEAVE A MESSAGE FOR THE NURSE SELECT OPTION 2, PLEASE LEAVE A MESSAGE INCLUDING: YOUR NAME DATE OF BIRTH CALL BACK NUMBER REASON FOR CALL**this is important as we prioritize the call backs  YOU WILL RECEIVE A CALL BACK THE SAME DAY AS LONG AS YOU CALL BEFORE 4:00 PM  At the [facility], you and your health needs are our priority. As part of our continuing mission to provide you with exceptional heart care, we have created designated Provider Care Teams. These Care Teams include your primary Cardiologist (physician) and Advanced Practice Providers (APPs- Physician Assistants and Nurse Practitioners) who all work together to provide you with the care you need, when you need it.   You may see any of the following providers on your designated Care Team at your next follow up: Dr Arvilla Meres Dr Marca Ancona Dr Brandon Melnick, NP Robbie Lis, [place] Mikki Santee Karle Plumber, PharmD   Please be sure to bring in all your medications bottles to every  appointment.

## 2021-08-21 NOTE — Progress Notes (Signed)
  Echocardiogram 2D Echocardiogram has been performed.  Brandon Huber 08/21/2021, 1:55 PM

## 2021-08-22 LAB — BRAIN NATRIURETIC PEPTIDE: B Natriuretic Peptide: 893.8 pg/mL — ABNORMAL HIGH (ref 0.0–100.0)

## 2021-09-04 ENCOUNTER — Other Ambulatory Visit (HOSPITAL_COMMUNITY): Payer: Self-pay | Admitting: Internal Medicine

## 2021-09-09 ENCOUNTER — Other Ambulatory Visit (HOSPITAL_COMMUNITY): Payer: Self-pay

## 2021-09-09 ENCOUNTER — Telehealth (HOSPITAL_COMMUNITY): Payer: Self-pay | Admitting: Pharmacy Technician

## 2021-09-09 NOTE — Telephone Encounter (Signed)
Advanced Heart Failure Patient Advocate Encounter  Patient called and left a message stating that he could not get Corlanor. Thought that the patient needed a new PA. I could not find active coverage for him at this time. Called patient to confirm insurance status. If the patient is currently uninsured, can start an application for [facility] assistance.   No answer and the patient's voicemail has not been set up at this time.  Archer Asa, CPhT

## 2021-09-09 NOTE — Telephone Encounter (Signed)
Advanced Heart Failure Patient Advocate Encounter  Called patient's pharmacy. The representative stated that he came in with a co-pay card and a medicaid card. The co-pay card does not work with medicaid. The medicaid the patient has is family planning, it will not pay for hf medications.  The patient called back and provided the [facility] billing information. Insurance rejected RXs. Says patient is not covered. Patient to call insurance and will call back if he still needs help. I urged him to call back either way. If his insurance will not be reinstated we can help with the HF fund and assistance applications. Patient has not been compliant with taking all medications.   Patient verbalized understanding.

## 2021-09-10 ENCOUNTER — Encounter (HOSPITAL_COMMUNITY): Payer: 59

## 2021-09-16 ENCOUNTER — Other Ambulatory Visit (HOSPITAL_COMMUNITY): Payer: Self-pay

## 2021-09-16 ENCOUNTER — Telehealth (HOSPITAL_COMMUNITY): Payer: Self-pay | Admitting: Pharmacy Technician

## 2021-09-16 ENCOUNTER — Other Ambulatory Visit (HOSPITAL_COMMUNITY): Payer: Self-pay | Admitting: *Deleted

## 2021-09-16 MED ORDER — DIGOXIN 125 MCG PO TABS
125.0000 ug | ORAL_TABLET | Freq: Every day | ORAL | 2 refills | Status: AC
  Filled 2021-09-16: qty 90, 90d supply, fill #0

## 2021-09-16 MED ORDER — POTASSIUM CHLORIDE CRYS ER 20 MEQ PO TBCR
80.0000 meq | EXTENDED_RELEASE_TABLET | Freq: Every day | ORAL | 3 refills | Status: DC
  Filled 2021-09-16: qty 360, 90d supply, fill #0

## 2021-09-16 MED ORDER — SPIRONOLACTONE 25 MG PO TABS
25.0000 mg | ORAL_TABLET | Freq: Every day | ORAL | 1 refills | Status: DC
  Filled 2021-09-16: qty 90, 90d supply, fill #0

## 2021-09-16 MED ORDER — TORSEMIDE 20 MG PO TABS
ORAL_TABLET | ORAL | 3 refills | Status: DC
  Filled 2021-09-16: qty 180, 90d supply, fill #0

## 2021-09-16 MED ORDER — CARVEDILOL 12.5 MG PO TABS
18.7500 mg | ORAL_TABLET | Freq: Two times a day (BID) | ORAL | 2 refills | Status: AC
  Filled 2021-09-16: qty 270, 90d supply, fill #0

## 2021-09-16 MED ORDER — DAPAGLIFLOZIN PROPANEDIOL 10 MG PO TABS
ORAL_TABLET | ORAL | 3 refills | Status: AC
  Filled 2021-09-16: qty 90, 90d supply, fill #0

## 2021-09-16 NOTE — Telephone Encounter (Signed)
Medication Samples have been provided to the patient.  Drug name: Corlanor       Strength: 5mg         Qty: 2  LOT  Exp.Date: 8/26  Dosing instructions: Take 1 tab Twice daily   The patient has been instructed regarding the correct time, dose, and frequency of taking this medication, including desired effects and most common side effects.   Shalen Petrak 2:27 PM 09/16/2021

## 2021-09-16 NOTE — Telephone Encounter (Signed)
Advanced Heart Failure Patient Advocate Encounter  Patient called back stating that his insurance was still inactive and had concerns regarding affordability of medications. He is actively working with the company to try and fix inactive status. Not sure how long it will take.  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 The patient can get remaining medications from the HF fund. He cannot drive to [facility] outpatient often, will mail the RXs from [facility] outpatient. Sent 90 day RX request to [facility] Advised the patient to let me know when insurance becomes active. Will not use the HF fund after that time.   Sent Heather (RN) a request for [employer].

## 2021-09-17 ENCOUNTER — Other Ambulatory Visit (HOSPITAL_COMMUNITY): Payer: Self-pay

## 2021-09-20 ENCOUNTER — Other Ambulatory Visit (HOSPITAL_COMMUNITY): Payer: Self-pay

## 2021-10-04 NOTE — Telephone Encounter (Signed)
Advanced Heart Failure Patient Advocate Encounter  Sent in [employer] application via fax. Will follow up. Social security number needed for [facility] application. I sent the patient an email requesting a call or return email regarding SS#.

## 2021-10-10 ENCOUNTER — Ambulatory Visit (HOSPITAL_COMMUNITY): Payer: 59 | Attending: Internal Medicine

## 2021-10-10 ENCOUNTER — Other Ambulatory Visit: Payer: Self-pay

## 2021-10-10 DIAGNOSIS — I5022 Chronic systolic (congestive) heart failure: Secondary | ICD-10-CM

## 2021-10-10 NOTE — Telephone Encounter (Signed)
Advanced Heart Failure Patient Advocate Encounter  Sent in [employer] application via fax.  Will follow up.  

## 2021-10-14 ENCOUNTER — Encounter (HOSPITAL_COMMUNITY): Payer: Self-pay | Admitting: Pharmacy Technician

## 2021-10-14 NOTE — Telephone Encounter (Signed)
Advanced Heart Failure Patient Advocate Encounter   Patient was approved to receive Entresto from [employer]  Patient ID: [id number] Effective dates: 10/08/21 through 10/08/22  Will follow up on [employer] application.   Called patient, attempted to leave vm regarding approval. No vm set up. Sent Mychart message.

## 2021-10-23 ENCOUNTER — Encounter (HOSPITAL_COMMUNITY): Payer: Self-pay | Admitting: Pharmacy Technician

## 2021-10-23 NOTE — Telephone Encounter (Signed)
Advanced Heart Failure Patient Advocate Encounter   Patient was approved to receive Corlanor from [facility]  Patient ID: [id number] Effective dates: 10/17/21 through 10/17/22  Sent patient approval via mychart.  Archer Asa, CPhT

## 2021-10-24 ENCOUNTER — Other Ambulatory Visit: Payer: Self-pay

## 2021-10-24 ENCOUNTER — Other Ambulatory Visit (HOSPITAL_COMMUNITY): Payer: Self-pay

## 2021-10-24 ENCOUNTER — Encounter (HOSPITAL_COMMUNITY): Payer: Self-pay | Admitting: Internal Medicine

## 2021-10-24 ENCOUNTER — Ambulatory Visit (HOSPITAL_COMMUNITY)
Admission: RE | Admit: 2021-10-24 | Discharge: 2021-10-24 | Disposition: A | Discharge status: Home / Self Care | Payer: 59 | Source: Ambulatory Visit | Attending: Internal Medicine | Admitting: Internal Medicine

## 2021-10-24 VITALS — BP 134/78 | HR 114 | Wt 264.2 lb

## 2021-10-24 DIAGNOSIS — R000 Tachycardia, unspecified: Secondary | ICD-10-CM

## 2021-10-24 DIAGNOSIS — Z79899 Other long term (current) drug therapy: Secondary | ICD-10-CM | POA: Diagnosis not present

## 2021-10-24 DIAGNOSIS — I5023 Acute on chronic systolic (congestive) heart failure: Secondary | ICD-10-CM | POA: Insufficient documentation

## 2021-10-24 DIAGNOSIS — Z7984 Long term (current) use of oral hypoglycemic drugs: Secondary | ICD-10-CM | POA: Insufficient documentation

## 2021-10-24 DIAGNOSIS — I110 Hypertensive heart disease with heart failure: Secondary | ICD-10-CM | POA: Insufficient documentation

## 2021-10-24 DIAGNOSIS — Z6835 Body mass index (BMI) 35.0-35.9, adult: Secondary | ICD-10-CM | POA: Insufficient documentation

## 2021-10-24 DIAGNOSIS — I5082 Biventricular heart failure: Secondary | ICD-10-CM | POA: Insufficient documentation

## 2021-10-24 DIAGNOSIS — I5022 Chronic systolic (congestive) heart failure: Secondary | ICD-10-CM

## 2021-10-24 LAB — BASIC METABOLIC PANEL
Anion gap: 9 (ref 5–15)
BUN: 10 mg/dL (ref 6–20)
CO2: 31 mmol/L (ref 22–32)
Calcium: 8.9 mg/dL (ref 8.9–10.3)
Chloride: 91 mmol/L — ABNORMAL LOW (ref 98–111)
Creatinine, Ser: 1.18 mg/dL (ref 0.61–1.24)
GFR, Estimated: >60 mL/min (ref >60)
Glucose, Bld: 117 mg/dL — ABNORMAL HIGH (ref 70–99)
Potassium: 3 mmol/L — ABNORMAL LOW (ref 3.5–5.1)
Sodium: 131 mmol/L — ABNORMAL LOW (ref 135–145)

## 2021-10-24 LAB — CBC
HCT: 38.3 % — ABNORMAL LOW (ref 39.0–52.0)
Hemoglobin: 12.4 g/dL — ABNORMAL LOW (ref 13.0–17.0)
MCH: 26.4 pg (ref 26.0–34.0)
MCHC: 32.4 g/dL (ref 30.0–36.0)
MCV: 81.5 fL (ref 80.0–100.0)
Platelets: 179 10*3/uL (ref 150–400)
RBC: 4.7 MIL/uL (ref 4.22–5.81)
RDW: 17.7 % — ABNORMAL HIGH (ref 11.5–15.5)
WBC: 5.8 10*3/uL (ref 4.0–10.5)
nRBC: 0 % (ref 0.0–0.2)

## 2021-10-24 LAB — BRAIN NATRIURETIC PEPTIDE: B Natriuretic Peptide: 1711.7 pg/mL — ABNORMAL HIGH (ref 0.0–100.0)

## 2021-10-24 MED ORDER — POTASSIUM CHLORIDE CRYS ER 20 MEQ PO TBCR: 80.0000 meq | EXTENDED_RELEASE_TABLET | Freq: Two times a day (BID) | ORAL | 3 refills | Status: DC

## 2021-10-24 MED ORDER — METOLAZONE 2.5 MG PO TABS: 2.5000 mg | ORAL_TABLET | ORAL | 0 refills | Status: DC

## 2021-10-24 MED ORDER — TORSEMIDE 20 MG PO TABS: 40.0000 mg | ORAL_TABLET | Freq: Two times a day (BID) | ORAL | 3 refills | Status: DC

## 2021-10-24 NOTE — Progress Notes (Signed)
Advanced Heart Failure Clinic Note   Referring Physician: PCP: Default, Provider, MD PCP-Cardiologist: Dr. Gala Romney  HPI:  Brandon Huber is a 30 yo with morbid obesity, severe HTN and tobacco abuse. He presented to [facility] on 7/20 with acute HF. Echocardiogram  EF < 25%, severely dilated LV 1-2+ Brandon. Left heart catheterization revealed no significant CAD. RHC with wedge of 20, Fick CO/CI 4.02/1.7.  He underwent cardiac MRI that showed no signs of infiltration but severely dilated LV and RV.    He was seen for the first time in HF Clinic on 09/28/19. He presented w/ NYHA IIIB-IV symptoms and marked volume overload in the setting of medication non compliance. He was directed admitted to hospital from clinic. Echo EF <20%.   Echo 11/21 EF 25%   We saw him on 01/23/21 and was pale, diaphoretic and tachycardic concerning for low output. RHC arranged 01/25/21 and patient looked much better on arrival with HR in 80-90s. Hemodynamics well compensated.   Last seen for follow-up in February. Appeared compensated. Weight 250-257 lb.  He is here today with his mom for follow-up on his CPX test.  Says he feels ok. 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 Says he can do ADLs and go to the store without problem. Much better than before. Can go up stairs. No CP, orthopnea or PND. + edema. Taking all meds.  But missed occasional doses of torsemide.   CPX 10/10/21  FVC 3.36 (67%)      FEV1 2.45 (59%)        FEV1/FVC 73 (87%)         MVV 111 (59%)       Resting HR: 107 Standing HR: 109 Peak HR: 158   (83% age predicted max HR)  BP rest: 102/60 Standing BP: 108/62 BP peak: 124/62  Peak VO2: 13.4 (38% predicted peak VO2)  VE/VCO2 slope:  48  Peak RER: 1.08  VE/MVV:  54%   Cardiac studies: CPX 04/22 submaximal effort, pVO2 15.0 slope 43 available data suggests severe functional impairment d/t heart failure with severely elevated DP/OE42 slope, mildly blunted BP response, desaturations down to 77% with  exercise  RHC 01/25/21 RA = 9 RV = 32/12 PA =  32/14 (23) PCW = 16 Fick cardiac output/index = 9.6/4.1 Thermo CO/CI = 7.4/3.1 PVR = 1.0 WU Ao sat = 97% PA sat = 81%, 82% SVC sat = 78%  Echo 09/28/19 EF 15%. Mild RV dysfunction Echo 01/04/20 EF 20-25% RV ok  Echo 11/21 EF 25%   cMRI 7/20 ([facility]): Markedly dilated left ventricle, dilated bilateral atria, global hypokinesis and reduced ejection fraction. Mild to moderate mitral valve regurgitation.   ZIO 10/21: SR average HR 111, rare PVC/PACs, no high-grade arrhythmias    Current Outpatient Medications  Medication Sig Dispense Refill   carvedilol (COREG) 12.5 MG tablet Take 1 and 1/2 tablets (18.75 mg total) by mouth 2 (two) times daily with a meal. 270 tablet 2   dapagliflozin propanediol (FARXIGA) 10 MG TABS tablet TAKE 1 TABLET (10 MG) BY MOUTH DAILY BEFORE BREAKFAST. 90 tablet 3   digoxin (LANOXIN) 0.125 MG tablet Take 1 tablet (125 mcg total) by mouth daily. 90 tablet 2   ivabradine (CORLANOR) 5 MG TABS tablet Take 1 tablet (5 mg total) by mouth 2 (two) times daily with a meal. 60 tablet 6   potassium chloride SA (KLOR-CON) 20 MEQ tablet Take 4 tablets (80 mEq total) by mouth daily. 360 tablet 3   sacubitril-valsartan (ENTRESTO) 97-103 MG  Take 1 tablet by mouth 2 (two) times daily.     spironolactone (ALDACTONE) 25 MG tablet Take 1 tablet (25 mg total) by mouth daily. 90 tablet 1   torsemide (DEMADEX) 20 MG tablet TAKE 2 TABLETS (40 MG) BY MOUTH DAILY 180 tablet 3   No current facility-administered medications for this encounter.    No Known Allergies    Social History   Socioeconomic History   Marital status: Unknown    Spouse name: Not on file   Number of children: Not on file   Years of education: Not on file   Highest education level: Not on file  Occupational History   Not on file  Tobacco Use   Smoking status: Never   Smokeless tobacco: Never  Substance and Sexual Activity   Alcohol use: Never   Drug use:  Not on file   Sexual activity: Not on file  Other Topics Concern   Not on file  Social History Narrative   Not on file   Social Determinants of Health   Financial Resource Strain: Not on file  Food Insecurity: Not on file  Transportation Needs: Not on file  Physical Activity: Not on file  Stress: Not on file  Social Connections: Not on file  Intimate Partner Violence: Not on file    Vitals:   10/24/21 1424  BP: 134/78  Pulse: (!) 114  SpO2: 98%  Weight: 119.8 kg (264 lb 3.2 oz)    Wt Readings from Last 3 Encounters:  10/24/21 119.8 kg (264 lb 3.2 oz)  08/21/21 115.3 kg (254 lb 3.2 oz)  02/25/21 115.4 kg (254 lb 6.4 oz)   Body mass index is 35.83 kg/m.  PHYSICAL EXAM: General:  Fatigued appearing. No resp difficulty HEENT: normal Neck: supple. no JVD. Carotids 2+ bilat; no bruits. No lymphadenopathy or thryomegaly appreciated. Cor: PMI laterally displaced. Regular tachy + s3 2/6 TR Lungs: clear Abdomen: obese soft, nontender, nondistended. No hepatosplenomegaly. No bruits or masses. Good bowel sounds. Extremities: no cyanosis, clubbing, rash, 2-3+ edema into thighs  Neuro: alert & orientedx3, cranial nerves grossly intact. moves all 4 extremities w/o difficulty. Affect pleasant   ECG: Sinus tach 114 No ST-T wave abnormalities. Personally reviewed   ASSESSMENT & PLAN:  1. Acute on chronic systolic HF:   - Diagnosed  06/2019 at [facility]. LVEF at 20% w/ biventricular failure. LHC 06/2019 showed normal coronaries. RHC showed mean wedge of 20, FICK CO/CI 4.02/1.7. cMRI showed no signs of infiltration but severely dilated LV and RV.  - Echo at [facility] 09/29/19 showed EF at 15% no LVH, has mildly reduced RV systolic dysfunction w/ normal PA systolic pressure. Mild Brandon.  - ? Etiology. ?OSA, hypertensive heart disease vs familial (father w/ CHF) - Echo 01/04/20  EF 20-25% - Echo 11/21 EF <20%, RV moderate to severely reduced - Zio monitor 10/21 showed SR average HR  111, no high-grade arrhythmias, rare PACs/PVs - RHC 01/25/21 well compensated hemodynamics (much better than expected) - CPX 04/22 submaximal effort RER 0.95 pVO2  15.0  VeVCO2 43 available data suggests severe functional impairment d/t heart failure - Echo 08/21/21: EF 15-20% RV mild HK. Severe Brandon/TR. ? Small laminated apical clot  - CPX 10/21 Peak VO2: 13.4 (38% predicted peak VO2) VE/VCO2 slope:  48 RER 1.08  - Stable/improved NYHA II-III.  - Volume status markedly elevated.  - Increased torsemide 40 daily -> 40 bid/ Give metolazone 2.5 x 1 with kdur 40   - Continue Entresto  97/103  Bid  - Continue spironolactone 25 mg daily.  - Continue Digoxin 0.125 - Continue Farxiga 10 daily - Continue carvedilol to 18.75mg  BID  -  Blood type AB+. Would be a good transplant candidate if HF does not improve w/ medical management.   - Continues to report NYHA II-early III symptoms despite persistent severe LV dysfunction, marked resting tachycardia and severe HF limitation of CPX. Long talk with him and his mom about need to start w/u for advanced therapies. They are in agreement. Will refer to Dr. Edwena Blow to see if he is transplant candidate.  - Defer ICD for now until decision made about advanced therapies    2. HTN: - BP well controlled  3. Obesity:  - Has lost 50 pounds. Body mass index is 35.83 kg/m. - Had sleep study in 2/21 with Dr. Mayford Knife  which was non-diagnostic due to no sleep. Will need in-lab sleep study. He has not scheduled. - No change   4. Tobacco Abuse:  - remains quit. No change  5. Possible laminated LV clot - can consider repeat cMRI. Does not appear high risk for embolization.  - No change  Total time spent 45 minutes. Over half that time spent discussing above.    Arvilla Meres, MD  2:36 PM

## 2021-10-24 NOTE — Patient Instructions (Signed)
Take Metolazone 2.5 mg FOR 1 DOSE, make sure to take 40 meq of Potassium with it  Increase Torsemide to 40 mg (2 tabs) Twice daily   Increase Potassium to 80 meq Twice daily   Labs done today, your results will be available in MyChart, we will contact you for abnormal readings.  Your physician recommends that you return for lab work in: 1 week  Your physician recommends that you schedule a follow-up appointment in: 2 weeks  Do the following things EVERYDAY: Weigh yourself in the morning before breakfast. Write it down and keep it in a log. Take your medicines as prescribed Eat low salt foods--Limit salt (sodium) to 2000 mg per day.  Stay as active as you can everyday Limit all fluids for the day to less than 2 liters  If you have any questions or concerns before your next appointment please send [place] a message through [place] or call our office at [phone].    TO LEAVE A MESSAGE FOR THE NURSE SELECT OPTION 2, PLEASE LEAVE A MESSAGE INCLUDING: YOUR NAME DATE OF BIRTH CALL BACK NUMBER REASON FOR CALL**this is important as we prioritize the call backs  YOU WILL RECEIVE A CALL BACK THE SAME DAY AS LONG AS YOU CALL BEFORE 4:00 PM  At the Advanced Heart Failure Clinic, you and your health needs are our priority. As part of our continuing mission to provide you with exceptional heart care, we have created designated Provider Care Teams. These Care Teams include your primary Cardiologist (physician) and Advanced Practice Providers (APPs- Physician Assistants and Nurse Practitioners) who all work together to provide you with the care you need, when you need it.   You may see any of the following providers on your designated Care Team at your next follow up: Dr Arvilla Meres Dr Carron Curie, NP Robbie Lis, [place] [facility] [place], [place] Karle Plumber, PharmD   Please be sure to bring in all your medications bottles to every appointment.

## 2021-10-25 ENCOUNTER — Telehealth: Payer: Self-pay | Admitting: Cardiology

## 2021-10-25 NOTE — Addendum Note (Signed)
Encounter addended by: Noralee Space, RN on: 10/25/2021 1:26 PM  Actions taken: Clinical Note Signed

## 2021-10-25 NOTE — Telephone Encounter (Signed)
Pt's mother called back and I gave instructions, an extra 80 meq tomorrow then and extra 40 meq daily which will be 100 meq BID up from 80 BID.  He will be told to have labs on Thursday.

## 2021-10-25 NOTE — Progress Notes (Signed)
Records and referral form for transplant eval faxed to [facility] at [phone]

## 2021-10-31 ENCOUNTER — Other Ambulatory Visit (HOSPITAL_COMMUNITY): Payer: 59

## 2021-10-31 ENCOUNTER — Encounter (HOSPITAL_COMMUNITY): Payer: Self-pay

## 2021-11-05 NOTE — Progress Notes (Signed)
Advanced Heart Failure Clinic Note   PCP: Default, Provider, MD PCP-Cardiologist: Dr. Gala Romney  HPI:  Brandon Huber is a 30 yo with morbid obesity, severe HTN, tobacco abuse and systolic heart failure/NICM.   He presented to [facility] on 7/20 with acute HF. Echocardiogram  EF < 25%, severely dilated LV 1-2+ Brandon. Left heart catheterization revealed no significant CAD. RHC with wedge of 20, Fick CO/CI 4.02/1.7.  He underwent cardiac MRI that showed no signs of infiltration but severely dilated LV and RV.    He was seen for the first time in HF Clinic on 09/28/19. He presented w/ NYHA IIIb-IV symptoms and marked volume overload in the setting of medication non compliance. He was directed admitted to hospital from clinic. Echo EF <20%.   Echo 11/21 EF 25%   We saw him on 01/23/21 and was pale, diaphoretic and tachycardic concerning for low output. RHC arranged 01/25/21 and patient looked much better on arrival with HR in 80-90s. Hemodynamics well compensated.   Last seen for follow-up in February. Appeared compensated. Weight 250-257 lb.  Follow up 10/24/21 markedly volume overloaded. Torsemide increased and instructed to take metolazone 2.5 x 1 + extra 40 KCL. Discussion had about advanced therapies and transplant. Referred to Dr. Edwena Blow.  Today he returns for HF follow up with his mom. He misunderstand medication instructions and took metolazone x 3 days, and is now down 31 lbs. Breathing is much better, still has some swelling legs but much-improved. Overall feeling fine with no SOB with activity. Denies CP, dizziness, edema, or PND/Orthopnea. Appetite ok. No fever or chills. Weight at home 239-240 pounds. Taking all medications. Trying to limit salty foods, does not eat out. Lives with mother, working as a Occupational psychologist.  CPX 10/10/21 FVC 3.36 (67%)      FEV1 2.45 (59%)        FEV1/FVC 73 (87%)         MVV 111 (59%)       Resting HR: 107 Standing HR: 109 Peak HR: 158   (83%  age predicted max HR)  BP rest: 102/60 Standing BP: 108/62 BP peak: 124/62  Peak VO2: 13.4 (38% predicted peak VO2)  VE/VCO2 slope:  48  Peak RER: 1.08  VE/MVV:  54%   Cardiac studies: CPX 04/22 submaximal effort, pVO2 15.0 slope 43 available data suggests severe functional impairment d/t heart failure with severely elevated WU/JW11 slope, mildly blunted BP response, desaturations down to 77% with exercise  RHC 01/25/21 RA = 9 RV = 32/12 PA =  32/14 (23) PCW = 16 Fick cardiac output/index = 9.6/4.1 Thermo CO/CI = 7.4/3.1 PVR = 1.0 WU Ao sat = 97% PA sat = 81%, 82% SVC sat = 78%  Echo 09/28/19 EF 15%. Mild RV dysfunction Echo 01/04/20 EF 20-25% RV ok  Echo 11/21 EF 25%   cMRI 7/20 ([facility]): Markedly dilated left ventricle, dilated bilateral atria, global hypokinesis and reduced ejection fraction. Mild to moderate mitral valve regurgitation.   ZIO 10/21: SR average HR 111, rare PVC/PACs, no high-grade arrhythmias  Current Outpatient Medications  Medication Sig Dispense Refill   carvedilol (COREG) 12.5 MG tablet Take 1 and 1/2 tablets (18.75 mg total) by mouth 2 (two) times daily with a meal. 270 tablet 2   dapagliflozin propanediol (FARXIGA) 10 MG TABS tablet TAKE 1 TABLET (10 MG) BY MOUTH DAILY BEFORE BREAKFAST. 90 tablet 3   digoxin (LANOXIN) 0.125 MG tablet Take 1 tablet (125 mcg total) by mouth daily. 90 tablet 2  ivabradine (CORLANOR) 5 MG TABS tablet Take 1 tablet (5 mg total) by mouth 2 (two) times daily with a meal. 60 tablet 6   metolazone (ZAROXOLYN) 2.5 MG tablet Take 1 tablet (2.5 mg total) by mouth as directed. 5 tablet 0   potassium chloride SA (KLOR-CON) 20 MEQ tablet Take 4 tablets (80 mEq total) by mouth 2 (two) times daily. 360 tablet 3   sacubitril-valsartan (ENTRESTO) 97-103 MG Take 1 tablet by mouth 2 (two) times daily.     spironolactone (ALDACTONE) 25 MG tablet Take 1 tablet (25 mg total) by mouth daily. 90 tablet 1   torsemide (DEMADEX) 20 MG tablet Take 2  tablets (40 mg total) by mouth 2 (two) times daily. 180 tablet 3   No current facility-administered medications for this encounter.   No Known Allergies  Social History   Socioeconomic History   Marital status: Single    Spouse name: Not on file   Number of children: Not on file   Years of education: Not on file   Highest education level: Not on file  Occupational History   Not on file  Tobacco Use   Smoking status: Never   Smokeless tobacco: Never  Substance and Sexual Activity   Alcohol use: Never   Drug use: Not on file   Sexual activity: Not on file  Other Topics Concern   Not on file  Social History Narrative   Not on file   Social Determinants of Health   Financial Resource Strain: Not on file  Food Insecurity: Not on file  Transportation Needs: Not on file  Physical Activity: Not on file  Stress: Not on file  Social Connections: Not on file  Intimate Partner Violence: Not on file   BP 120/80   Pulse 96   Wt 106 kg (233 lb 9.6 oz)   SpO2 99%   BMI 31.68 kg/m   Wt Readings from Last 3 Encounters:  11/06/21 106 kg (233 lb 9.6 oz)  10/24/21 119.8 kg (264 lb 3.2 oz)  08/21/21 115.3 kg (254 lb 3.2 oz)   Body mass index is 31.68 kg/m.  PHYSICAL EXAM: General:  NAD. No resp difficulty, chronically-ill appearing, pale. HEENT: Normal Neck: Supple. JVP 7-8. Carotids 2+ bilat; no bruits. No lymphadenopathy or thryomegaly appreciated. Cor: PMI laterally displaced. Regular rate & rhythm. No rubs, gallops, 2/6 +TR Lungs: Decreased BS in bases Abdomen: Soft, nontender, nondistended. No hepatosplenomegaly. No bruits or masses. Good bowel sounds. Extremities: No cyanosis, clubbing, rash, 1-2+ BLE edema, +varicose veins Neuro: Alert & oriented x 3, cranial nerves grossly intact. Moves all 4 extremities w/o difficulty. Affect pleasant.  ReDs: 44%  ASSESSMENT & PLAN: 1. Chronic systolic HF:   - Diagnosed  06/2019 at [facility]. LVEF at 20% w/  biventricular failure. LHC 06/2019 showed normal coronaries. RHC showed mean wedge of 20, FICK CO/CI 4.02/1.7. cMRI showed no signs of infiltration but severely dilated LV and RV.  - Echo at [facility] 09/29/19 showed EF at 15% no LVH, has mildly reduced RV systolic dysfunction w/ normal PA systolic pressure. Mild Brandon.  - ? Etiology. ?OSA, hypertensive heart disease vs familial (father w/ CHF) - Echo 01/04/20  EF 20-25% - Echo 11/21 EF <20%, RV moderate to severely reduced - Zio monitor 10/21 showed SR average HR 111, no high-grade arrhythmias, rare PACs/PVs - RHC 01/25/21 well compensated hemodynamics (much better than expected) - CPX 04/22 submaximal effort RER 0.95 pVO2  15.0  VeVCO2 43 available data suggests severe  functional impairment d/t heart failure - Echo 08/21/21: EF 15-20% RV mild HK. Severe Brandon/TR. ? Small laminated apical clot  - CPX 10/22 Peak VO2: 13.4 (38% predicted peak VO2) VE/VCO2 slope:  48 RER 1.08  - Stable/improved NYHA II. Volume status remains elevated, ReDs 44%. - Place compression hose. - Increase torsemide to 60 mg bid. - Continue Entresto 97/103 mg bid. - Continue spironolactone 25 mg daily.  - Continue digoxin 0.125 mg daily. - Continue Farxiga 10 mg daily. - Continue carvedilol 18.75 mg bid. - Continue ivabradine 5 mg bid. HR 96 today. Consider increasing next visit.  -  Blood type AB+. Would be a good transplant candidate if HF does not improve w/ medical management.   - Continues to report NYHA II-early III symptoms despite persistent severe LV dysfunction, marked resting tachycardia and severe HF limitation of CPX. He and his mother are in agreement to start w/u for advanced therapies. He has been referred to Dr. Edwena Blow to see if he is transplant candidate.  - Defer ICD for now until decision made about advanced therapies  - BMET, BNP, dig level today; repeat BMET in 7-10 days.   2. HTN: - BP well controlled. - Diurese as anove.  3. Obesity:  - Has lost 50 pounds.   - Body mass index is 31.68 kg/m. - Had sleep study in 2/21 with Dr. Mayford Knife  which was non-diagnostic due to no sleep. Will need in-lab sleep study. He has not scheduled. - No change.   4. Tobacco Abuse:  - Remains quit. No change.  5. Possible laminated LV clot - Can consider repeat cMRI. Does not appear high risk for embolization.  - No change.  I suspect he will need additional KCL supp after taking metolazone x 3 days. Discussed again today not to take more metolazone than directed by HF clinic.Will call after labs result for further KCl recommendations.  Follow up with APP in 3 weeks and with Dr. Gala Romney in 3-4 months.  Jacklynn Ganong, FNP  3:56 PM

## 2021-11-06 ENCOUNTER — Ambulatory Visit (HOSPITAL_COMMUNITY)
Admission: RE | Admit: 2021-11-06 | Discharge: 2021-11-06 | Disposition: A | Discharge status: Home / Self Care | Payer: 59 | Source: Ambulatory Visit | Attending: Family Medicine | Admitting: Family Medicine

## 2021-11-06 ENCOUNTER — Other Ambulatory Visit: Payer: Self-pay

## 2021-11-06 ENCOUNTER — Encounter (HOSPITAL_COMMUNITY): Payer: Self-pay

## 2021-11-06 VITALS — BP 120/80 | HR 96 | Wt 233.6 lb

## 2021-11-06 DIAGNOSIS — I5022 Chronic systolic (congestive) heart failure: Secondary | ICD-10-CM

## 2021-11-06 DIAGNOSIS — Z6831 Body mass index (BMI) 31.0-31.9, adult: Secondary | ICD-10-CM | POA: Insufficient documentation

## 2021-11-06 DIAGNOSIS — E669 Obesity, unspecified: Secondary | ICD-10-CM

## 2021-11-06 DIAGNOSIS — I110 Hypertensive heart disease with heart failure: Secondary | ICD-10-CM | POA: Insufficient documentation

## 2021-11-06 DIAGNOSIS — Z79899 Other long term (current) drug therapy: Secondary | ICD-10-CM | POA: Diagnosis not present

## 2021-11-06 DIAGNOSIS — Z87891 Personal history of nicotine dependence: Secondary | ICD-10-CM

## 2021-11-06 DIAGNOSIS — Z8249 Family history of ischemic heart disease and other diseases of the circulatory system: Secondary | ICD-10-CM | POA: Diagnosis not present

## 2021-11-06 DIAGNOSIS — Z7984 Long term (current) use of oral hypoglycemic drugs: Secondary | ICD-10-CM | POA: Diagnosis not present

## 2021-11-06 DIAGNOSIS — I10 Essential (primary) hypertension: Secondary | ICD-10-CM | POA: Diagnosis not present

## 2021-11-06 DIAGNOSIS — I5082 Biventricular heart failure: Secondary | ICD-10-CM | POA: Insufficient documentation

## 2021-11-06 DIAGNOSIS — I340 Nonrheumatic mitral (valve) insufficiency: Secondary | ICD-10-CM | POA: Insufficient documentation

## 2021-11-06 MED ORDER — TORSEMIDE 20 MG PO TABS: 60.0000 mg | ORAL_TABLET | Freq: Two times a day (BID) | ORAL | 3 refills | Status: DC

## 2021-11-06 NOTE — Progress Notes (Signed)
ReDS Vest / Clip - 11/06/21 1600       ReDS Vest / Clip   Station Marker C    Ruler Value 28    ReDS Value Range High volume overload    ReDS Actual Value 44

## 2021-11-06 NOTE — Patient Instructions (Addendum)
INCREASE Torsemide 60 mg, (3 tabs) twice a day  Labs today We will only contact you if something comes back abnormal or we need to make some changes. Otherwise no news is good news!  Labs needed in 7-10 in days  Your physician recommends that you schedule a follow-up appointment in: 3-4 weeks with  in the Advanced Practitioners (PA/NP) Clinic and in 3-4 months Dr Gala Romney  Do the following things EVERYDAY: Weigh yourself in the morning before breakfast. Write it down and keep it in a log. Take your medicines as prescribed Eat low salt foods--Limit salt (sodium) to 2000 mg per day.  Stay as active as you can everyday Limit all fluids for the day to less than 2 liters  At the Advanced Heart Failure Clinic, you and your health needs are our priority. As part of our continuing mission to provide you with exceptional heart care, we have created designated Provider Care Teams. These Care Teams include your primary Cardiologist (physician) and Advanced Practice Providers (APPs- Physician Assistants and Nurse Practitioners) who all work together to provide you with the care you need, when you need it.   You may see any of the following providers on your designated Care Team at your next follow up: Dr Arvilla Meres Dr Carron Curie, NP Robbie Lis, [place] [facility] [place], [place] Karle Plumber, PharmD   Please be sure to bring in all your medications bottles to every appointment.   If you have any questions or concerns before your next appointment please send [place] a message through [place] or call our office at [phone].    TO LEAVE A MESSAGE FOR THE NURSE SELECT OPTION 2, PLEASE LEAVE A MESSAGE INCLUDING: YOUR NAME DATE OF BIRTH CALL BACK NUMBER REASON FOR CALL**this is important as we prioritize the call backs  YOU WILL RECEIVE A CALL BACK THE SAME DAY AS LONG AS YOU CALL BEFORE 4:00 PM

## 2021-11-07 ENCOUNTER — Telehealth (HOSPITAL_COMMUNITY): Payer: Self-pay | Admitting: *Deleted

## 2021-11-07 ENCOUNTER — Other Ambulatory Visit (HOSPITAL_COMMUNITY): Payer: Self-pay | Admitting: *Deleted

## 2021-11-07 DIAGNOSIS — I5022 Chronic systolic (congestive) heart failure: Secondary | ICD-10-CM

## 2021-11-07 LAB — BASIC METABOLIC PANEL
Anion gap: 17 — ABNORMAL HIGH (ref 5–15)
BUN: 15 mg/dL (ref 6–20)
CO2: 36 mmol/L — ABNORMAL HIGH (ref 22–32)
Calcium: 9 mg/dL (ref 8.9–10.3)
Chloride: 81 mmol/L — ABNORMAL LOW (ref 98–111)
Creatinine, Ser: 1.1 mg/dL (ref 0.61–1.24)
GFR, Estimated: >60 mL/min (ref >60)
Glucose, Bld: 41 mg/dL — CL (ref 70–99)
Potassium: 2.3 mmol/L — CL (ref 3.5–5.1)
Sodium: 134 mmol/L — ABNORMAL LOW (ref 135–145)

## 2021-11-07 LAB — BRAIN NATRIURETIC PEPTIDE: B Natriuretic Peptide: 1037.1 pg/mL — ABNORMAL HIGH (ref 0.0–100.0)

## 2021-11-07 LAB — DIGOXIN LEVEL: Digoxin Level: <0.2 ng/mL — ABNORMAL LOW (ref 0.8–2.0)

## 2021-11-07 NOTE — Telephone Encounter (Signed)
Critical lab results reported from the main lab. Potassium 2.3 and glucose 41. I called and spoke with patient. Patient said he had not eaten before office visit yesterday and that he is not diabetic so he does not have supplies to recheck glucose. Pt said he takes potassium as prescribed but he did not take any of his meds yesterday prior to his appt. Per Dinah Beers route message to Dr.Bensimhon because Melany Guernsey is out of the office.  Routed to Dr.Bensimhon

## 2021-11-07 NOTE — Telephone Encounter (Signed)
P aware and agreeable with plan but wants labs drawn at [facility] in [place] order faxed to  [facility] [phone]

## 2021-11-08 ENCOUNTER — Other Ambulatory Visit: Payer: Self-pay | Admitting: Internal Medicine

## 2021-11-09 LAB — BASIC METABOLIC PANEL
BUN/Creatinine Ratio: 8 — ABNORMAL LOW (ref 9–20)
BUN: 9 mg/dL (ref 6–20)
CO2: 31 mmol/L — ABNORMAL HIGH (ref 20–29)
Calcium: 9.2 mg/dL (ref 8.7–10.2)
Chloride: 79 mmol/L — ABNORMAL LOW (ref 96–106)
Creatinine, Ser: 1.18 mg/dL (ref 0.76–1.27)
Glucose: 104 mg/dL — ABNORMAL HIGH (ref 70–99)
Potassium: 2.5 mmol/L — ABNORMAL LOW (ref 3.5–5.2)
Sodium: 129 mmol/L — ABNORMAL LOW (ref 134–144)
eGFR: 86 mL/min/{1.73_m2} (ref >59)

## 2021-12-03 NOTE — Progress Notes (Incomplete)
Advanced Heart Failure Clinic Note   PCP: Default, Provider, MD PCP-Cardiologist: Dr. Gala Romney  HPI:  Brandon Huber is a 30 yo with morbid obesity, severe HTN, tobacco abuse and systolic heart failure/NICM.   He presented to [facility] on 7/20 with acute HF. Echocardiogram  EF < 25%, severely dilated LV 1-2+ Brandon. Left heart catheterization revealed no significant CAD. RHC with wedge of 20, Fick CO/CI 4.02/1.7.  He underwent cardiac MRI that showed no signs of infiltration but severely dilated LV and RV.    He was seen for the first time in HF Clinic on 09/28/19. He presented w/ NYHA IIIb-IV symptoms and marked volume overload in the setting of medication non compliance. He was directed admitted to hospital from clinic. Echo EF <20%.   Echo 11/21 EF 25%   We saw him on 01/23/21 and was pale, diaphoretic and tachycardic concerning for low output. RHC arranged 01/25/21 and patient looked much better on arrival with HR in 80-90s. Hemodynamics well compensated.   Last seen for follow-up in February. Appeared compensated. Weight 250-257 lb.  Follow up 10/24/21 markedly volume overloaded. Torsemide increased and instructed to take metolazone 2.5 x 1 + extra 40 KCL. Discussion had about advanced therapies and transplant. Referred to Dr. Edwena Blow.  Today he returns for HF follow up with his mom. He misunderstand medication instructions and took metolazone x 3 days, and is now down 31 lbs. Breathing is much better, still has some swelling legs but much-improved. Overall feeling fine with no SOB with activity. Denies CP, dizziness, edema, or PND/Orthopnea. Appetite ok. No fever or chills. Weight at home 239-240 pounds. Taking all medications. Trying to limit salty foods, does not eat out. Lives with mother, working as a Occupational psychologist.  CPX 10/10/21 FVC 3.36 (67%)      FEV1 2.45 (59%)        FEV1/FVC 73 (87%)         MVV 111 (59%)       Resting HR: 107 Standing HR: 109 Peak HR: 158   (83%  age predicted max HR)  BP rest: 102/60 Standing BP: 108/62 BP peak: 124/62  Peak VO2: 13.4 (38% predicted peak VO2)  VE/VCO2 slope:  48  Peak RER: 1.08  VE/MVV:  54%   Cardiac studies: CPX 04/22 submaximal effort, pVO2 15.0 slope 43 available data suggests severe functional impairment d/t heart failure with severely elevated HQ/PR91 slope, mildly blunted BP response, desaturations down to 77% with exercise  RHC 01/25/21 RA = 9 RV = 32/12 PA =  32/14 (23) PCW = 16 Fick cardiac output/index = 9.6/4.1 Thermo CO/CI = 7.4/3.1 PVR = 1.0 WU Ao sat = 97% PA sat = 81%, 82% SVC sat = 78%  Echo 09/28/19 EF 15%. Mild RV dysfunction Echo 01/04/20 EF 20-25% RV ok  Echo 11/21 EF 25%   cMRI 7/20 ([facility]): Markedly dilated left ventricle, dilated bilateral atria, global hypokinesis and reduced ejection fraction. Mild to moderate mitral valve regurgitation.   ZIO 10/21: SR average HR 111, rare PVC/PACs, no high-grade arrhythmias  Current Outpatient Medications  Medication Sig Dispense Refill   carvedilol (COREG) 12.5 MG tablet Take 1 and 1/2 tablets (18.75 mg total) by mouth 2 (two) times daily with a meal. 270 tablet 2   dapagliflozin propanediol (FARXIGA) 10 MG TABS tablet TAKE 1 TABLET (10 MG) BY MOUTH DAILY BEFORE BREAKFAST. 90 tablet 3   digoxin (LANOXIN) 0.125 MG tablet Take 1 tablet (125 mcg total) by mouth daily. 90 tablet 2  ivabradine (CORLANOR) 5 MG TABS tablet Take 1 tablet (5 mg total) by mouth 2 (two) times daily with a meal. 60 tablet 6   metolazone (ZAROXOLYN) 2.5 MG tablet Take 1 tablet (2.5 mg total) by mouth as directed. 5 tablet 0   potassium chloride SA (KLOR-CON) 20 MEQ tablet Take 4 tablets (80 mEq total) by mouth 2 (two) times daily. 360 tablet 3   sacubitril-valsartan (ENTRESTO) 97-103 MG Take 1 tablet by mouth 2 (two) times daily.     spironolactone (ALDACTONE) 25 MG tablet Take 1 tablet (25 mg total) by mouth daily. 90 tablet 1   torsemide (DEMADEX) 20 MG tablet Take 3  tablets (60 mg total) by mouth 2 (two) times daily. 180 tablet 3   No current facility-administered medications for this visit.   No Known Allergies  Social History   Socioeconomic History   Marital status: Single    Spouse name: Not on file   Number of children: Not on file   Years of education: Not on file   Highest education level: Not on file  Occupational History   Not on file  Tobacco Use   Smoking status: Never   Smokeless tobacco: Never  Substance and Sexual Activity   Alcohol use: Never   Drug use: Not on file   Sexual activity: Not on file  Other Topics Concern   Not on file  Social History Narrative   Not on file   Social Determinants of Health   Financial Resource Strain: Not on file  Food Insecurity: Not on file  Transportation Needs: Not on file  Physical Activity: Not on file  Stress: Not on file  Social Connections: Not on file  Intimate Partner Violence: Not on file   There were no vitals taken for this visit.  Wt Readings from Last 3 Encounters:  11/06/21 106 kg (233 lb 9.6 oz)  10/24/21 119.8 kg (264 lb 3.2 oz)  08/21/21 115.3 kg (254 lb 3.2 oz)   There is no height or weight on file to calculate BMI.  PHYSICAL EXAM: General:  NAD. No resp difficulty, chronically-ill appearing, pale. HEENT: Normal Neck: Supple. JVP 7-8. Carotids 2+ bilat; no bruits. No lymphadenopathy or thryomegaly appreciated. Cor: PMI laterally displaced. Regular rate & rhythm. No rubs, gallops, 2/6 +TR Lungs: Decreased BS in bases Abdomen: Soft, nontender, nondistended. No hepatosplenomegaly. No bruits or masses. Good bowel sounds. Extremities: No cyanosis, clubbing, rash, 1-2+ BLE edema, +varicose veins Neuro: Alert & oriented x 3, cranial nerves grossly intact. Moves all 4 extremities w/o difficulty. Affect pleasant.  ReDs: 44%  ASSESSMENT & PLAN: 1. Chronic systolic HF:   - Diagnosed  06/2019 at [facility]. LVEF at 20% w/ biventricular failure. LHC  06/2019 showed normal coronaries. RHC showed mean wedge of 20, FICK CO/CI 4.02/1.7. cMRI showed no signs of infiltration but severely dilated LV and RV.  - Echo at [facility] 09/29/19 showed EF at 15% no LVH, has mildly reduced RV systolic dysfunction w/ normal PA systolic pressure. Mild Brandon.  - ? Etiology. ?OSA, hypertensive heart disease vs familial (father w/ CHF) - Echo 01/04/20  EF 20-25% - Echo 11/21 EF <20%, RV moderate to severely reduced - Zio monitor 10/21 showed SR average HR 111, no high-grade arrhythmias, rare PACs/PVs - RHC 01/25/21 well compensated hemodynamics (much better than expected) - CPX 04/22 submaximal effort RER 0.95 pVO2  15.0  VeVCO2 43 available data suggests severe functional impairment d/t heart failure - Echo 08/21/21: EF 15-20% RV mild  HK. Severe Brandon/TR. ? Small laminated apical clot  - CPX 10/22 Peak VO2: 13.4 (38% predicted peak VO2) VE/VCO2 slope:  48 RER 1.08  - Stable/improved NYHA II. Volume status remains elevated, ReDs 44%. - Place compression hose. - Increase torsemide to 60 mg bid. - Continue Entresto 97/103 mg bid. - Continue spironolactone 25 mg daily.  - Continue digoxin 0.125 mg daily. - Continue Farxiga 10 mg daily. - Continue carvedilol 18.75 mg bid. - Continue ivabradine 5 mg bid. HR 96 today. Consider increasing next visit.  -  Blood type AB+. Would be a good transplant candidate if HF does not improve w/ medical management.   - Continues to report NYHA II-early III symptoms despite persistent severe LV dysfunction, marked resting tachycardia and severe HF limitation of CPX. He and his mother are in agreement to start w/u for advanced therapies. He has been referred to Dr. Edwena Blow to see if he is transplant candidate.  - Defer ICD for now until decision made about advanced therapies  - BMET, BNP, dig level today; repeat BMET in 7-10 days.   2. HTN: - BP well controlled. - Diurese as anove.  3. Obesity:  - Has lost 50 pounds.  - There is no height or  weight on file to calculate BMI. - Had sleep study in 2/21 with Dr. Mayford Knife  which was non-diagnostic due to no sleep. Will need in-lab sleep study. He has not scheduled. - No change.   4. Tobacco Abuse:  - Remains quit. No change.  5. Possible laminated LV clot - Can consider repeat cMRI. Does not appear high risk for embolization.  - No change.  I suspect he will need additional KCL supp after taking metolazone x 3 days. Discussed again today not to take more metolazone than directed by HF clinic.Will call after labs result for further KCl recommendations.  Follow up with APP in 3 weeks and with Dr. Gala Romney in 3-4 months.  Jacklynn Ganong, FNP  8:13 AM

## 2021-12-04 ENCOUNTER — Encounter (HOSPITAL_COMMUNITY): Payer: 59

## 2021-12-24 ENCOUNTER — Ambulatory Visit (HOSPITAL_COMMUNITY): Admission: RE | Admit: 2021-12-24 | Payer: 59 | Source: Ambulatory Visit

## 2022-01-03 ENCOUNTER — Encounter (HOSPITAL_COMMUNITY): Payer: Self-pay

## 2022-01-03 ENCOUNTER — Encounter (HOSPITAL_COMMUNITY): Payer: 59

## 2022-02-06 ENCOUNTER — Ambulatory Visit (HOSPITAL_COMMUNITY)
Admission: RE | Admit: 2022-02-06 | Discharge: 2022-02-06 | Disposition: A | Discharge status: Home / Self Care | Payer: 59 | Source: Ambulatory Visit | Attending: Internal Medicine | Admitting: Internal Medicine

## 2022-02-06 ENCOUNTER — Encounter (HOSPITAL_COMMUNITY): Payer: Self-pay | Admitting: Internal Medicine

## 2022-02-06 ENCOUNTER — Other Ambulatory Visit: Payer: Self-pay

## 2022-02-06 VITALS — BP 94/68 | HR 97 | Wt 254.8 lb

## 2022-02-06 DIAGNOSIS — E669 Obesity, unspecified: Secondary | ICD-10-CM | POA: Diagnosis not present

## 2022-02-06 DIAGNOSIS — I5022 Chronic systolic (congestive) heart failure: Secondary | ICD-10-CM

## 2022-02-06 DIAGNOSIS — I110 Hypertensive heart disease with heart failure: Secondary | ICD-10-CM | POA: Insufficient documentation

## 2022-02-06 DIAGNOSIS — Z79899 Other long term (current) drug therapy: Secondary | ICD-10-CM | POA: Diagnosis not present

## 2022-02-06 DIAGNOSIS — I10 Essential (primary) hypertension: Secondary | ICD-10-CM | POA: Diagnosis not present

## 2022-02-06 DIAGNOSIS — I5082 Biventricular heart failure: Secondary | ICD-10-CM | POA: Diagnosis not present

## 2022-02-06 DIAGNOSIS — Z6834 Body mass index (BMI) 34.0-34.9, adult: Secondary | ICD-10-CM | POA: Diagnosis not present

## 2022-02-06 DIAGNOSIS — Z87891 Personal history of nicotine dependence: Secondary | ICD-10-CM | POA: Insufficient documentation

## 2022-02-06 LAB — BASIC METABOLIC PANEL
Anion gap: 12 (ref 5–15)
BUN: 7 mg/dL (ref 6–20)
CO2: 33 mmol/L — ABNORMAL HIGH (ref 22–32)
Calcium: 8.3 mg/dL — ABNORMAL LOW (ref 8.9–10.3)
Chloride: 90 mmol/L — ABNORMAL LOW (ref 98–111)
Creatinine, Ser: 1.06 mg/dL (ref 0.61–1.24)
GFR, Estimated: >60 mL/min (ref >60)
Glucose, Bld: 123 mg/dL — ABNORMAL HIGH (ref 70–99)
Potassium: 2.5 mmol/L — CL (ref 3.5–5.1)
Sodium: 135 mmol/L (ref 135–145)

## 2022-02-06 LAB — CBC
HCT: 32 % — ABNORMAL LOW (ref 39.0–52.0)
Hemoglobin: 10.8 g/dL — ABNORMAL LOW (ref 13.0–17.0)
MCH: 27.8 pg (ref 26.0–34.0)
MCHC: 33.8 g/dL (ref 30.0–36.0)
MCV: 82.5 fL (ref 80.0–100.0)
Platelets: 168 10*3/uL (ref 150–400)
RBC: 3.88 MIL/uL — ABNORMAL LOW (ref 4.22–5.81)
RDW: 17.1 % — ABNORMAL HIGH (ref 11.5–15.5)
WBC: 5.6 10*3/uL (ref 4.0–10.5)
nRBC: 0 % (ref 0.0–0.2)

## 2022-02-06 LAB — BRAIN NATRIURETIC PEPTIDE: B Natriuretic Peptide: 2372.7 pg/mL — ABNORMAL HIGH (ref 0.0–100.0)

## 2022-02-06 LAB — DIGOXIN LEVEL: Digoxin Level: <0.2 ng/mL — ABNORMAL LOW (ref 0.8–2.0)

## 2022-02-06 MED ORDER — TORSEMIDE 20 MG PO TABS: 60.0000 mg | ORAL_TABLET | Freq: Every day | ORAL | 11 refills | Status: AC

## 2022-02-06 NOTE — Addendum Note (Signed)
Encounter addended by: Jerl Mina, RN on: 02/06/2022 2:34 PM  Actions taken: Diagnosis association updated, Pharmacy for encounter modified, Order list changed, Charge Capture section accepted, Pend clinical note, Clinical Note Signed

## 2022-02-06 NOTE — Addendum Note (Signed)
Encounter addended by: Suezanne Cheshire, RN on: 02/06/2022 2:31 PM  Actions taken: Flowsheet accepted, Clinical Note Signed

## 2022-02-06 NOTE — Patient Instructions (Signed)
Medication Changes:  Increase Torsemide to 60 mg Daily   Lab Work:  Labs done today, your results will be available in MyChart, we will contact you for abnormal readings.   Testing/Procedures:  Your physician has requested that you have an echocardiogram. Echocardiography is a painless test that uses sound waves to create images of your heart. It provides your doctor with information about the size and shape of your heart and how well your hearts chambers and valves are working. This procedure takes approximately one hour. There are no restrictions for this procedure.   Referrals:  You have been referred to [facility] in [place] for a Transplant Evaluation. They will contact you.  You have been referred to Electrophysiology for an ICD. They will call you to schedule an appointment.    Special Instructions // Education:  none  Follow-Up in: 3 months  At the Advanced Heart Failure Clinic, you and your health needs are our priority. We have a designated team specialized in the treatment of Heart Failure. This Care Team includes your primary Heart Failure Specialized Cardiologist (physician), Advanced Practice Providers (APPs- Physician Assistants and Nurse Practitioners), and Pharmacist who all work together to provide you with the care you need, when you need it.   You may see any of the following providers on your designated Care Team at your next follow up:  Dr Arvilla Meres Dr Carron Curie, NP Robbie Lis, [place] [facility] [place], [place] Karle Plumber, PharmD   Please be sure to bring in all your medications bottles to every appointment.   Need to Contact us:  If you have any questions or concerns before your next appointment please send [place] a message through [place] or call our office at [phone].    TO LEAVE A MESSAGE FOR THE NURSE SELECT OPTION 2, PLEASE LEAVE A MESSAGE INCLUDING: YOUR NAME DATE OF BIRTH CALL BACK  NUMBER REASON FOR CALL**this is important as we prioritize the call backs  YOU WILL RECEIVE A CALL BACK THE SAME DAY AS LONG AS YOU CALL BEFORE 4:00 PM

## 2022-02-06 NOTE — Progress Notes (Signed)
Advanced Heart Failure Clinic Note   PCP: Default, Provider, MD PCP-Cardiologist: Dr. Gala Romney  HPI:  Mr Maros is a 31 yo with morbid obesity, severe HTN, tobacco abuse and systolic heart failure/NICM.   He presented to [facility] on 7/20 with acute HF. Echo EF < 25%, severely dilated LV 1-2+ MR. LHC: no sCAD. RHC PCWP 20, Fick CO/CI 4.02/1.7. cMRI no LGE but severely dilated LV and RV.   Seen in HF Clinic in 9/20. NYHA IIIb-IV and marked volume overload in the setting of medication non compliance. He was directed admitted to hospital from clinic. Echo EF <20%.   Echo 11/21 EF 25%   We saw him on 01/23/21 and was pale, diaphoretic and tachycardic concerning for low output. RHC arranged 01/25/21 and patient looked much better on arrival with HR in 80-90s. Hemodynamics well compensated.   Referred to [facility] in 10/22 after CPX for possible transplant but was out of network so couldn't make an appt.   Today he returns for HF follow up. Doing ok. Weight up 20 pounds. + edema. Also says he has been eating bad. 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 Tries to ride his stationary bike for 5-10 mins 2x/day. Mild DOE. Taking torsemide 40 daily (at last visit he was increased to 60 bid but got confused)     CPX 10/10/21 FVC 3.36 (67%)      FEV1 2.45 (59%)        FEV1/FVC 73 (87%)         MVV 111 (59%)       Resting HR: 107 Standing HR: 109 Peak HR: 158   (83% age predicted max HR)  BP rest: 102/60 Standing BP: 108/62 BP peak: 124/62  Peak VO2: 13.4 (38% predicted peak VO2)  VE/VCO2 slope:  48  Peak RER: 1.08  VE/MVV:  54%   Cardiac studies: CPX 04/22 submaximal effort, pVO2 15.0 slope 43 available data suggests severe functional impairment d/t heart failure with severely elevated QZ/RA07 slope, mildly blunted BP response, desaturations down to 77% with exercise  RHC 01/25/21 RA = 9 RV = 32/12 PA =  32/14 (23) PCW = 16 Fick  cardiac output/index = 9.6/4.1 Thermo CO/CI = 7.4/3.1 PVR = 1.0 WU Ao sat = 97% PA sat = 81%, 82% SVC sat = 78%  Echo 09/28/19 EF 15%. Mild RV dysfunction Echo 01/04/20 EF 20-25% RV ok  Echo 11/21 EF 25%   cMRI 7/20 ([facility]): Markedly dilated left ventricle, dilated bilateral atria, global hypokinesis and reduced ejection fraction. Mild to moderate mitral valve regurgitation.   ZIO 10/21: SR average HR 111, rare PVC/PACs, no high-grade arrhythmias  Current Outpatient Medications  Medication Sig Dispense Refill   carvedilol (COREG) 12.5 MG tablet Take 1 and 1/2 tablets (18.75 mg total) by mouth 2 (two) times daily with a meal. 270 tablet 2   dapagliflozin propanediol (FARXIGA) 10 MG TABS tablet TAKE 1 TABLET (10 MG) BY MOUTH DAILY BEFORE BREAKFAST. 90 tablet 3   digoxin (LANOXIN) 0.125 MG tablet Take 1 tablet (125 mcg total) by mouth daily. 90 tablet 2   ivabradine (CORLANOR) 5 MG TABS tablet Take 1 tablet (5 mg total) by mouth 2 (two) times daily with a meal. 60 tablet 6   metolazone (ZAROXOLYN) 2.5 MG tablet Take 1 tablet (2.5 mg total) by mouth as directed. 5 tablet 0   potassium chloride SA (KLOR-CON) 20 MEQ tablet Take 4 tablets (80 mEq total) by mouth  2 (two) times daily. 360 tablet 3   sacubitril-valsartan (ENTRESTO) 97-103 MG Take 1 tablet by mouth 2 (two) times daily.     spironolactone (ALDACTONE) 25 MG tablet Take 1 tablet (25 mg total) by mouth daily. 90 tablet 1   torsemide (DEMADEX) 20 MG tablet Take 40 mg by mouth daily.     No current facility-administered medications for this encounter.   No Known Allergies  Social History   Socioeconomic History   Marital status: Single    Spouse name: Not on file   Number of children: Not on file   Years of education: Not on file   Highest education level: Not on file  Occupational History   Not on file  Tobacco Use   Smoking status: Never   Smokeless tobacco: Never  Substance and Sexual Activity   Alcohol use: Never    Drug use: Not on file   Sexual activity: Not on file  Other Topics Concern   Not on file  Social History Narrative   Not on file   Social Determinants of Health   Financial Resource Strain: Not on file  Food Insecurity: Not on file  Transportation Needs: Not on file  Physical Activity: Not on file  Stress: Not on file  Social Connections: Not on file  Intimate Partner Violence: Not on file   BP 94/68    Pulse 97    Wt 115.6 kg (254 lb 12.8 oz)    SpO2 97%    BMI 34.56 kg/m   Wt Readings from Last 3 Encounters:  02/06/22 115.6 kg (254 lb 12.8 oz)  11/06/21 106 kg (233 lb 9.6 oz)  10/24/21 119.8 kg (264 lb 3.2 oz)   Body mass index is 34.56 kg/m.  PHYSICAL EXAM: General:  Well appearing. No resp difficulty HEENT: normal Neck: supple. no JVD. Carotids 2+ bilat; no bruits. No lymphadenopathy or thryomegaly appreciated. Cor: PMI nondisplaced. Regular rate & rhythm. No rubs, gallops or murmurs. Lungs: clear Abdomen: obese soft, nontender, nondistended. No hepatosplenomegaly. No bruits or masses. Good bowel sounds. Extremities: no cyanosis, clubbing, rash, edema Neuro: alert & orientedx3, cranial nerves grossly intact. moves all 4 extremities w/o difficulty. Affect pleasant  ECG: Sinus 94 IVCD 108 ms Personally reviewed   ASSESSMENT & PLAN:  1. Chronic systolic HF:   - Diagnosed  06/2019 at [facility]. LVEF at 20% w/ biventricular failure. LHC 7/20 normal coronaries. RHC showed mean wedge of 20, FICK CO/CI 4.02/1.7. cMRI showed no signs of infiltration but severely dilated LV and RV.  - ? Etiology. ?OSA, hypertensive heart disease vs familial (father w/ CHF) - Echo 11/21 EF <20%, RV moderate to severely reduced - Zio monitor 10/21 showed SR average HR 111, no high-grade arrhythmias, rare PACs/PVs - RHC 01/25/21 well compensated hemodynamics (much better than expected) - CPX 4/22 submaximal effort RER 0.95 pVO2  15.0  VeVCO2 43suggests severe functional impairment  d/t heart failure - Echo 08/21/21: EF 15-20% RV mild HK. Severe MR/TR. ? Small laminated apical clot  - CPX 10/22 Peak VO2: 13.4 (38% predicted peak VO2) VE/VCO2 slope:  48 RER 1.08  - Stable/improved NYHA II-early III. Volume status elevated. - Increase torsemide to 60 mg daily.  - Continue Entresto 97/103 mg bid. - Continue spironolactone 25 mg daily.  - Continue digoxin 0.125 mg daily. - Continue Farxiga 10 mg daily. - Continue carvedilol 18.75 mg bid. - Continue ivabradine 5 mg bid. HR 94 today.   -  Blood type AB+. Body  mass index is 34.56 kg/m.  - Have referred to [facility] for possible transplant eval but not in network. Will refer to [facility]. Based on improved symptoms recently likely still too early for advanced therapies but would be good to get him plugged in - Refer for ICD - Should get genetic eval - Labs today    2. HTN: - BP now low in setting of advanced HF - Diurese as anove.  3. Obesity:  - Has lost 50 pounds.  - Body mass index is 34.56 kg/m. - Had sleep study in 2/21 with Dr. Mayford Knife  which was non-diagnostic due to no sleep. Will need in-lab sleep study. He has not scheduled. - No change.   4. Tobacco Abuse:  - Remains quit. No change.   Arvilla Meres, MD  2:02 PM

## 2022-02-06 NOTE — Progress Notes (Signed)
ReDS Vest / Clip - 02/06/22 1400       ReDS Vest / Clip   Station Marker C    Ruler Value 31    ReDS Value Range High volume overload    ReDS Actual Value 41

## 2022-02-17 ENCOUNTER — Telehealth (HOSPITAL_COMMUNITY): Payer: Self-pay | Admitting: Surgery

## 2022-02-17 DIAGNOSIS — I5022 Chronic systolic (congestive) heart failure: Secondary | ICD-10-CM

## 2022-02-17 NOTE — Telephone Encounter (Signed)
Patient contacted and would like to have labs done closer to [place].  He will go to [facility] and lab in [place] tomorrow for BMET. I will enter orders in [facility].

## 2022-02-17 NOTE — Telephone Encounter (Signed)
-----   Message from Dolores Patty, MD sent at 02/15/2022  4:25 PM EST ----- Lets make sure we repeat BMET early this week please. Thanks!

## 2022-02-18 ENCOUNTER — Other Ambulatory Visit: Payer: Self-pay | Admitting: Internal Medicine

## 2022-02-18 ENCOUNTER — Telehealth (HOSPITAL_COMMUNITY): Payer: Self-pay | Admitting: Surgery

## 2022-02-18 NOTE — Telephone Encounter (Signed)
Patient called in reference to his labwork scheduled for today and indicated that his lab did not receive the entered order for labs to be performed.  I faxed an order for labs to his lab agency of choice.

## 2022-02-19 LAB — BASIC METABOLIC PANEL
BUN/Creatinine Ratio: 5 — ABNORMAL LOW (ref 9–20)
BUN: 5 mg/dL — ABNORMAL LOW (ref 6–20)
CO2: 28 mmol/L (ref 20–29)
Calcium: 9.4 mg/dL (ref 8.7–10.2)
Chloride: 88 mmol/L — ABNORMAL LOW (ref 96–106)
Creatinine, Ser: 1.02 mg/dL (ref 0.76–1.27)
Glucose: 133 mg/dL — ABNORMAL HIGH (ref 70–99)
Potassium: 3.2 mmol/L — ABNORMAL LOW (ref 3.5–5.2)
Sodium: 135 mmol/L (ref 134–144)
eGFR: 102 mL/min/{1.73_m2} (ref >59)

## 2022-02-19 LAB — SPECIMEN STATUS REPORT

## 2022-03-28 ENCOUNTER — Other Ambulatory Visit: Payer: Self-pay | Admitting: *Deleted

## 2022-03-28 ENCOUNTER — Ambulatory Visit (INDEPENDENT_AMBULATORY_CARE_PROVIDER_SITE_OTHER): Payer: 59 | Admitting: Internal Medicine

## 2022-03-28 ENCOUNTER — Encounter: Payer: Self-pay | Admitting: *Deleted

## 2022-03-28 ENCOUNTER — Encounter: Payer: Self-pay | Admitting: Internal Medicine

## 2022-03-28 VITALS — BP 112/74 | HR 104 | Ht 72.0 in | Wt 236.4 lb

## 2022-03-28 DIAGNOSIS — I5022 Chronic systolic (congestive) heart failure: Secondary | ICD-10-CM | POA: Diagnosis not present

## 2022-03-28 MED ORDER — MAGNESIUM OXIDE 400 MG PO TABS: 400.0000 mg | ORAL_TABLET | Freq: Two times a day (BID) | ORAL | 3 refills | Status: AC

## 2022-03-28 NOTE — Progress Notes (Signed)
? ? ? ? ?ELECTROPHYSIOLOGY CONSULT NOTE  ?Patient ID: Brandon Huber, MRN: [id number], DOB/AGE: 02/19/1991 30 y.o. ?Admit date: (Not on file) ?Date of Consult: 03/28/2022 ? ?Primary Physician: Default, Provider, MD ?Primary Cardiologist: DB ?  ?  ?Brandon Huber is a 30 y.o. male who is being seen today for the evaluation of ICD at the request of DB.  ? ? ?HPI ?Brandon Huber is a 30 y.o. male referred for consideration of an ICD. ? ?Presented 7/20 with acute congestive failure to [facility]; found to have severe LV dysfunction with an ejection fraction of less than 20%, catheterization without obstructive coronary disease and cMRI without gadolinium enhancement. ? ?Has been considered for transplant referral based on CPX showing severe functional limitation.  But subsequently 10/22 significant improvement in his CPX.  Mild dyspnea on exertion.  Awareness of his heart beating fast.  No extra fast palpitations or irregularities.  No recent syncope ? ?Has been persistently tachycardic on a combination of beta-blockers and ivabradine ? ? ? ? ? ?DATE TEST EF   ?7/20 Echo   <20 %   ?7/20 LHC  No obstructive coronary disease  ?7/20 cMRI (CE)  No LGE  ?8/22 Echo  15.-20   ?     ? ?Date Cr K Hgb  ?2/23 1.02 3.2 10.8    ?03/11/22 0.75 2.6 12.1  ? ? ? ? ?Past Medical History:  ?Diagnosis Date  ? CHF (congestive heart failure) (HCC)   ?   ? ?Surgical History:  ?Past Surgical History:  ?Procedure Laterality Date  ? RIGHT HEART CATH N/A 01/25/2021  ? Procedure: RIGHT HEART CATH;  Surgeon: Bensimhon, Daniel R, MD;  Location: [facility];  Service: Cardiovascular;  Laterality: N/A;  ?  ? ?Home Meds: ?Current Meds  ?Medication Sig  ? carvedilol (COREG) 12.5 MG tablet Take 1 and 1/2 tablets (18.75 mg total) by mouth 2 (two) times daily with a meal.  ? dapagliflozin propanediol (FARXIGA) 10 MG TABS tablet TAKE 1 TABLET (10 MG) BY MOUTH DAILY BEFORE BREAKFAST.  ? digoxin (LANOXIN) 0.125 MG tablet Take 1 tablet (125 mcg  total) by mouth daily.  ? ivabradine (CORLANOR) 5 MG TABS tablet Take 1 tablet (5 mg total) by mouth 2 (two) times daily with a meal.  ? potassium chloride SA (KLOR-CON M) 20 MEQ tablet Take 4 tablets by mouth 3 (three) times daily.  ? sacubitril-valsartan (ENTRESTO) 97-103 MG Take 1 tablet by mouth 2 (two) times daily.  ? spironolactone (ALDACTONE) 25 MG tablet Take 1.5 tablets by mouth daily.  ? torsemide (DEMADEX) 20 MG tablet Take 3 tablets (60 mg total) by mouth daily.  ? ? ?Allergies: No Known Allergies ? ?Social History  ? ?Socioeconomic History  ? Marital status: Single  ?  Spouse name: Not on file  ? Number of children: Not on file  ? Years of education: Not on file  ? Highest education level: Not on file  ?Occupational History  ? Not on file  ?Tobacco Use  ? Smoking status: Never  ? Smokeless tobacco: Never  ?Substance and Sexual Activity  ? Alcohol use: Never  ? Drug use: Not on file  ? Sexual activity: Not on file  ?Other Topics Concern  ? Not on file  ?Social History Narrative  ? Not on file  ? ?Social Determinants of Health  ? ?Financial Resource Strain: Not on file  ?Food Insecurity: Not on file  ?Transportation Needs: Not on file  ?Physical Activity: Not on file  ?  Stress: Not on file  ?Social Connections: Not on file  ?Intimate Partner Violence: Not on file  ?  ? ?No family history on file.  ? ?ROS:  Please see the history of present illness.     All other systems reviewed and negative.  ? ? ?Physical Exam:  ?Blood pressure 112/74, pulse (!) 104, height 6' (1.829 m), weight 236 lb 6.4 oz (107.2 kg), SpO2 97 %. ?General: Well developed, well nourished male in no acute distress. ?Head: Normocephalic, atraumatic, sclera non-icteric, no xanthomas, nares are without discharge. ?EENT: normal  ?Lymph Nodes:  none ?Neck: Negative for carotid bruits. JVD not elevated. ?Back:without scoliosis kyphosis ?Lungs: Clear bilaterally to auscultation without wheezes, rales, or rhonchi. Breathing is unlabored. ?Heart:  RRR with S1 S2. No  murmur . No rubs, or gallops appreciated. ?Abdomen: Soft, non-tender, non-distended with normoactive bowel sounds. No hepatomegaly. No rebound/guarding. No obvious abdominal masses. ?Msk:  Strength and tone appear normal for age. ?Extremities: No clubbing or cyanosis. No  edema.  Distal pedal pulses are 2+ and equal bilaterally. ?Skin: Warm and Dry ?Neuro: Alert and oriented X 3. CN III-XII intact Grossly normal sensory and motor function . ?Psych:  Responds to questions appropriately with a normal affect. ?  ?  ?  ? ?EKG: Sinus at 104 ?Interval 17/13/38 ?IVCD ? ? ?Assessment and Plan:  ?Nonischemic cardiomyopathy with biventricular dysfunction left greater than right and severe biatrial enlargement ? ?IVCD ? ?Sinus tachycardia ? ?TR severe ? ?MR severe ? ?Gynecomastia ? ?Hypokalemia ?Patient has persistent left ventricular dysfunction despite maximally tolerated guideline directed therapy.  He has been appropriately considered for an ICD and after some reluctance, has agreed to proceed.  Have reviewed risks including but not limited to death perforation infection lead dislodgment and inappropriate shocks, he voices understanding and with shared decision-making has agreed to proceed.  Also reviewed its potential benefits in reducing mortality ? ?His tachycardia persists; I wonder whether he would benefit from up titration of his ivabradine. ? ?His IVCD is short and I do not think that there is a role for CRT.  We will consider with colleagues the potential role of concomitant left bundle branch area pacing ? ?Discussed transvenous versus subcutaneous device; given his severe TR and the absence of need for general anesthesia and the target of transplant I think it is more appropriate to proceed with transvenous ICD and the patient is agreeable ? ?Potassium has been low and was low again at [facility].  They increased his potassium a little bit; we will plan to recheck it again on Monday.  He is  already on spironolactone.  He may be potassium wasting in the context of hypomagnesemia.  We will begin him empirically on magnesium today and check again his magnesium with his metabolic profile on Monday. ? ?He says his gynecomastia is longstanding.  I wonder if there is any benefit to eplerenone.  Will defer to Dr. DB. ?Rustyn Conery  ?

## 2022-03-28 NOTE — H&P (View-Only) (Signed)
? ? ? ? ?ELECTROPHYSIOLOGY CONSULT NOTE  ?Patient ID: Brandon Huber, MRN: [id number], DOB/AGE: September 18, 1991 31 y.o. ?Admit date: (Not on file) ?Date of Consult: 03/28/2022 ? ?Primary Physician: Default, Provider, MD ?Primary Cardiologist: DB ?  ?  ?Brandon Huber is a 31 y.o. male who is being seen today for the evaluation of ICD at the request of DB.  ? ? ?HPI ?Brandon Huber is a 31 y.o. male referred for consideration of an ICD. ? ?Presented 7/20 with acute congestive failure to [facility]; found to have severe LV dysfunction with an ejection fraction of less than 20%, catheterization without obstructive coronary disease and cMRI without gadolinium enhancement. ? ?Has been considered for transplant referral based on CPX showing severe functional limitation.  But subsequently 10/22 significant improvement in his CPX.  Mild dyspnea on exertion.  Awareness of his heart beating fast.  No extra fast palpitations or irregularities.  No recent syncope ? ?Has been persistently tachycardic on a combination of beta-blockers and ivabradine ? ? ? ? ? ?DATE TEST EF   ?7/20 Echo   <20 %   ?7/20 LHC  No obstructive coronary disease  ?7/20 cMRI ([facility])  No LGE  ?8/22 Echo  15.-20   ?     ? ?Date Cr K Hgb  ?2/23 1.02 3.2 10.8    ?03/11/22 0.75 2.6 12.1  ? ? ? ? ?Past Medical History:  ?Diagnosis Date  ? CHF (congestive heart failure) ([place])   ?   ? ?Surgical History:  ?Past Surgical History:  ?Procedure Laterality Date  ? RIGHT HEART CATH N/A 01/25/2021  ? Procedure: RIGHT HEART CATH;  Surgeon: Jolaine Artist, MD;  Location: [facility];  Service: Cardiovascular;  Laterality: N/A;  ?  ? ?Home Meds: ?Current Meds  ?Medication Sig  ? carvedilol (COREG) 12.5 MG tablet Take 1 and 1/2 tablets (18.75 mg total) by mouth 2 (two) times daily with a meal.  ? dapagliflozin propanediol (FARXIGA) 10 MG TABS tablet TAKE 1 TABLET (10 MG) BY MOUTH DAILY BEFORE BREAKFAST.  ? digoxin (LANOXIN) 0.125 MG tablet Take 1 tablet (125 mcg  total) by mouth daily.  ? ivabradine (CORLANOR) 5 MG TABS tablet Take 1 tablet (5 mg total) by mouth 2 (two) times daily with a meal.  ? potassium chloride SA (KLOR-CON M) 20 MEQ tablet Take 4 tablets by mouth 3 (three) times daily.  ? sacubitril-valsartan (ENTRESTO) 97-103 MG Take 1 tablet by mouth 2 (two) times daily.  ? spironolactone (ALDACTONE) 25 MG tablet Take 1.5 tablets by mouth daily.  ? torsemide (DEMADEX) 20 MG tablet Take 3 tablets (60 mg total) by mouth daily.  ? ? ?Allergies: No Known Allergies ? ?Social History  ? ?Socioeconomic History  ? Marital status: Single  ?  Spouse name: Not on file  ? Number of children: Not on file  ? Years of education: Not on file  ? Highest education level: Not on file  ?Occupational History  ? Not on file  ?Tobacco Use  ? Smoking status: Never  ? Smokeless tobacco: Never  ?Substance and Sexual Activity  ? Alcohol use: Never  ? Drug use: Not on file  ? Sexual activity: Not on file  ?Other Topics Concern  ? Not on file  ?Social History Narrative  ? Not on file  ? ?Social Determinants of Health  ? ?Financial Resource Strain: Not on file  ?Food Insecurity: Not on file  ?Transportation Needs: Not on file  ?Physical Activity: Not on file  ?  Stress: Not on file  ?Social Connections: Not on file  ?Intimate Partner Violence: Not on file  ?  ? ?No family history on file.  ? ?ROS:  Please see the history of present illness.     All other systems reviewed and negative.  ? ? ?Physical Exam:  ?Blood pressure 112/74, pulse (!) 104, height 6' (1.829 m), weight 236 lb 6.4 oz (107.2 kg), SpO2 97 %. ?General: Well developed, well nourished male in no acute distress. ?Head: Normocephalic, atraumatic, sclera non-icteric, no xanthomas, nares are without discharge. ?EENT: normal  ?Lymph Nodes:  none ?Neck: Negative for carotid bruits. JVD not elevated. ?Back:without scoliosis kyphosis ?Lungs: Clear bilaterally to auscultation without wheezes, rales, or rhonchi. Breathing is unlabored. ?Heart:  RRR with S1 S2. No  murmur . No rubs, or gallops appreciated. ?Abdomen: Soft, non-tender, non-distended with normoactive bowel sounds. No hepatomegaly. No rebound/guarding. No obvious abdominal masses. ?Msk:  Strength and tone appear normal for age. ?Extremities: No clubbing or cyanosis. No  edema.  Distal pedal pulses are 2+ and equal bilaterally. ?Skin: Warm and Dry ?Neuro: Alert and oriented X 3. CN III-XII intact Grossly normal sensory and motor function . ?Psych:  Responds to questions appropriately with a normal affect. ?  ?  ?  ? ?EKG: Sinus at 104 ?Interval 17/13/38 ?IVCD ? ? ?Assessment and Plan:  ?Nonischemic cardiomyopathy with biventricular dysfunction left greater than right and severe biatrial enlargement ? ?IVCD ? ?Sinus tachycardia ? ?TR severe ? ?MR severe ? ?Gynecomastia ? ?Hypokalemia ?Patient has persistent left ventricular dysfunction despite maximally tolerated guideline directed therapy.  He has been appropriately considered for an ICD and after some reluctance, has agreed to proceed.  Have reviewed risks including but not limited to death perforation infection lead dislodgment and inappropriate shocks, he voices understanding and with shared decision-making has agreed to proceed.  Also reviewed its potential benefits in reducing mortality ? ?His tachycardia persists; I wonder whether he would benefit from up titration of his ivabradine. ? ?His IVCD is short and I do not think that there is a role for CRT.  We will consider with colleagues the potential role of concomitant left bundle branch area pacing ? ?Discussed transvenous versus subcutaneous device; given his severe TR and the absence of need for general anesthesia and the target of transplant I think it is more appropriate to proceed with transvenous ICD and the patient is agreeable ? ?Potassium has been low and was low again at [facility].  They increased his potassium a little bit; we will plan to recheck it again on Monday.  He is  already on spironolactone.  He may be potassium wasting in the context of hypomagnesemia.  We will begin him empirically on magnesium today and check again his magnesium with his metabolic profile on Monday. ? ?He says his gynecomastia is longstanding.  I wonder if there is any benefit to eplerenone.  Will defer to Dr. Reine Just. ?Brandon Huber  ?

## 2022-03-28 NOTE — Patient Instructions (Signed)
Medication Instructions:  ?Your physician recommends that you continue on your current medications as directed. Please refer to the Current Medication list given to you today. ? ?   * If you need a refill on your cardiac medications before your next appointment, please call your pharmacy. * ? ? ?Labwork: ?Pre procedure lab work today: BMET & CBC w/ diff ? ?If you have labs (blood work) drawn today and your tests are completely normal, you will receive your results only by: ?MyChart Message (if you have MyChart) OR ?A paper copy in the mail ?If you have any lab test that is abnormal or we need to change your treatment, we will call you to review the results. ? ? ?Testing/Procedures: ?Your physician has recommended that you have a defibrillator inserted. An implantable cardioverter defibrillator (ICD) is a small device that is placed in your chest or, in rare cases, your abdomen. This device uses electrical pulses or shocks to help control life-threatening, irregular heartbeats that could lead the heart to suddenly stop beating (sudden cardiac arrest). Leads are attached to the ICD that goes into your heart. This is done in the hospital and usually requires an overnight stay. Please follow the instruction letter given to you today ? ? ?Follow-Up: ?Your physician recommends that you schedule a wound check appointment 10-14 days, after your procedure on 04/07/22, with the device clinic. ? ?Your physician recommends that you schedule a follow up appointment in 91 days, after your procedure on 04/07/22, with Dr. Graciela Husbands. ? ?Thank you for choosing [facility]!! ? ? ?[phone] ? ? ?Other Instructions ? ? ?Cardioverter Defibrillator Implantation ?An implantable cardioverter defibrillator (ICD) is a small, lightweight, battery-powered device that is placed (implanted) under the skin in the chest or abdomen. Your caregiver may prescribe an ICD if: ?You have had an irregular heart rhythm (arrhythmia) that originated in the  lower chambers of the heart (ventricles). ?Your heart has been damaged by a disease (such as coronary artery disease) or heart condition (such as a heart attack). ?An ICD consists of a battery that lasts several years, a small computer called a pulse generator, and wires called leads that go into the heart. It is used to detect and correct two dangerous arrhythmias: a rapid heart rhythm (tachycardia) and an arrhythmia in which the ventricles contract in an uncoordinated way (fibrillation). When an ICD detects tachycardia, it sends an electrical signal to the heart that restores the heartbeat to normal (cardioversion). This signal is usually painless. If cardioversion does not work or if the ICD detects fibrillation, it delivers a small electrical shock to the heart (defibrillation) to restart the heart. The shock may feel like a strong jolt in the chest. ICDs may be programmed to correct other problems. Sometimes, ICDs are programmed to act as another type of implantable device called a pacemaker. Pacemakers are used to treat a slow heartbeat (bradycardia). ?LET YOUR CAREGIVER KNOW ABOUT: ?Any allergies you have. ?All medicines you are taking, including vitamins, herbs, eyedrops, and over-the-counter medicines and creams. ?Previous problems you or members of your family have had with the use of anesthetics. ?Any blood disorders you have had. ?Other health problems you have. ?RISKS AND COMPLICATIONS ?Generally, the procedure to implant an ICD is safe. However, as with any surgical procedure, complications can occur. Possible complications associated with implanting an ICD include: ?Swelling, bleeding, or bruising at the site where the ICD was implanted. ?Infection at the site where the ICD was implanted. ?A reaction to medicine used during  the procedure. ?Nerve, heart, or blood vessel damage. ?Blood clots. ?BEFORE THE PROCEDURE ?You may need to have blood tests, heart tests, or a chest X-ray done before the day of  the procedure. ?Ask your caregiver about changing or stopping your regular medicines. ?Make plans to have someone drive you home. You may need to stay in the hospital overnight after the procedure. ?Stop smoking at least 24 hours before the procedure. ?Take a bath or shower the night before the procedure. You may need to scrub your chest or abdomen with a special type of soap. ?Do not eat or drink before your procedure for as long as directed by your caregiver. Ask if it is okay to take any needed medicine with a small sip of water. ?PROCEDURE  ?The procedure to implant an ICD in your chest or abdomen is usually done at a hospital in a room that has a large X-ray machine called a fluoroscope. The machine will be above you during the procedure. It will help your caregiver see your heart during the procedure. Implanting an ICD usually takes 1-3 hours. Before the procedure:  ?Small monitors will be put on your body. They will be used to check your heart, blood pressure, and oxygen level. ?A needle will be put into a vein in your hand or arm. This is called an intravenous (IV) access tube. Fluids and medicine will flow directly into your body through the IV tube. ?Your chest or abdomen will be cleaned with a germ-killing (antiseptic) solution. The area may be shaved. ?You may be given medicine to help you relax (sedative). ?You will be given a medicine called a local anesthetic. This medicine will make the surgical site numb while the ICD is implanted. You will be sleepy but awake during the procedure. ?After you are numb the procedure will begin. The caregiver will: ?Make a small cut (incision). This will make a pocket deep under your skin that will hold the pulse generator. ?Guide the leads through a large blood vessel into your heart and attach them to the heart muscles. Depending on the ICD, the leads may go into one ventricle or they may go to both ventricles and into an upper chamber of the heart (atrium). ?Test  the ICD. ?Close the incision with stitches, glue, or staples. ?AFTER THE PROCEDURE ?You may feel pain. Some pain is normal. It may last a few days. ?You may stay in a recovery area until the local anesthetic has worn off. Your blood pressure and pulse will be checked often. You will be taken to a room where your heart will be monitored. ?A chest X-ray will be taken. This is done to check that the cardioverter defibrillator is in the right place. ?You may stay in the hospital overnight. ?A slight bump may be seen over the skin where the ICD was placed. Sometimes, it is possible to feel the ICD under the skin. This is normal. ?In the months and years afterward, your caregiver will check the device, the leads, and the battery every few months. Eventually, when the battery is low, the ICD will be replaced. ?  ?This information is not intended to replace advice given to you by your health care provider. Make sure you discuss any questions you have with your health care provider. ?  ?Document Released: 09/06/2002 Document Revised: 10/05/2013 Document Reviewed: 01/03/2013 ?Elsevier Interactive Patient Education ?2016 [employer] ? ? ? ?Cardioverter Defibrillator Implantation, Care After ?This sheet gives you information about how to care for  yourself after your procedure. Your health care provider may also give you more specific instructions. If you have problems or questions, contact your health care provider. ?What can I expect after the procedure? ?After the procedure, it is common to have: ?Some pain. It may last a few days. ?A slight bump over the skin where the device was placed. Sometimes, it is possible to feel the device under the skin. This is normal. ? ?During the months and years after your procedure, your health care provider will check the device, the leads, and the battery every few months. Eventually, when the battery is low, the device will be replaced. ?Follow these instructions at  home: ?Medicines ?Take over-the-counter and prescription medicines only as told by your health care provider. ?If you were prescribed an antibiotic medicine, take it as told by your health care provider. Do not stop taking t

## 2022-04-04 NOTE — Pre-Procedure Instructions (Signed)
Instructed patient on the following items: Arrival time 1000 Nothing to eat or drink after midnight No meds AM of procedure Responsible person to drive you home and stay with you for 24 hrs Wash with special soap night before and morning of procedure  

## 2022-04-07 ENCOUNTER — Other Ambulatory Visit: Payer: Self-pay

## 2022-04-07 ENCOUNTER — Ambulatory Visit (HOSPITAL_COMMUNITY)
Admission: RE | Admit: 2022-04-07 | Discharge: 2022-04-07 | Disposition: A | Discharge status: Home / Self Care | Payer: 59 | Attending: Internal Medicine | Admitting: Internal Medicine

## 2022-04-07 ENCOUNTER — Ambulatory Visit (HOSPITAL_COMMUNITY): Payer: 59

## 2022-04-07 ENCOUNTER — Ambulatory Visit (HOSPITAL_COMMUNITY)
Admission: RE | Disposition: A | Discharge status: Home / Self Care | Payer: Self-pay | Source: Home / Self Care | Attending: Internal Medicine

## 2022-04-07 ENCOUNTER — Other Ambulatory Visit: Payer: Self-pay | Admitting: Physician Assistant

## 2022-04-07 DIAGNOSIS — N62 Hypertrophy of breast: Secondary | ICD-10-CM | POA: Diagnosis not present

## 2022-04-07 DIAGNOSIS — Z79899 Other long term (current) drug therapy: Secondary | ICD-10-CM | POA: Diagnosis not present

## 2022-04-07 DIAGNOSIS — R000 Tachycardia, unspecified: Secondary | ICD-10-CM | POA: Insufficient documentation

## 2022-04-07 DIAGNOSIS — I428 Other cardiomyopathies: Secondary | ICD-10-CM | POA: Insufficient documentation

## 2022-04-07 DIAGNOSIS — R0609 Other forms of dyspnea: Secondary | ICD-10-CM | POA: Diagnosis not present

## 2022-04-07 DIAGNOSIS — I081 Rheumatic disorders of both mitral and tricuspid valves: Secondary | ICD-10-CM | POA: Insufficient documentation

## 2022-04-07 DIAGNOSIS — I5023 Acute on chronic systolic (congestive) heart failure: Secondary | ICD-10-CM

## 2022-04-07 DIAGNOSIS — E876 Hypokalemia: Secondary | ICD-10-CM | POA: Diagnosis not present

## 2022-04-07 HISTORY — PX: ICD IMPLANT: EP1208

## 2022-04-07 LAB — CBC
HCT: 36.5 % — ABNORMAL LOW (ref 39.0–52.0)
Hemoglobin: 12.2 g/dL — ABNORMAL LOW (ref 13.0–17.0)
MCH: 27.1 pg (ref 26.0–34.0)
MCHC: 33.4 g/dL (ref 30.0–36.0)
MCV: 81.1 fL (ref 80.0–100.0)
Platelets: 180 10*3/uL (ref 150–400)
RBC: 4.5 MIL/uL (ref 4.22–5.81)
RDW: 16.9 % — ABNORMAL HIGH (ref 11.5–15.5)
WBC: 5.9 10*3/uL (ref 4.0–10.5)
nRBC: 0 % (ref 0.0–0.2)

## 2022-04-07 LAB — BASIC METABOLIC PANEL
Anion gap: 12 (ref 5–15)
BUN: 7 mg/dL (ref 6–20)
CO2: 32 mmol/L (ref 22–32)
Calcium: 8.7 mg/dL — ABNORMAL LOW (ref 8.9–10.3)
Chloride: 89 mmol/L — ABNORMAL LOW (ref 98–111)
Creatinine, Ser: 1.07 mg/dL (ref 0.61–1.24)
GFR, Estimated: >60 mL/min (ref >60)
Glucose, Bld: 119 mg/dL — ABNORMAL HIGH (ref 70–99)
Potassium: 2.6 mmol/L — CL (ref 3.5–5.1)
Sodium: 133 mmol/L — ABNORMAL LOW (ref 135–145)

## 2022-04-07 SURGERY — ICD IMPLANT

## 2022-04-07 MED ORDER — POTASSIUM CHLORIDE CRYS ER 20 MEQ PO TBCR
80.0000 meq | EXTENDED_RELEASE_TABLET | Freq: Once | ORAL | Status: AC
  Administered 2022-04-07: 80 meq via ORAL
  Filled 2022-04-07: qty 4

## 2022-04-07 MED ORDER — LIDOCAINE HCL (PF) 1 % IJ SOLN
INTRAMUSCULAR | Status: DC | PRN
  Administered 2022-04-07: 60 mL

## 2022-04-07 MED ORDER — FENTANYL CITRATE (PF) 100 MCG/2ML IJ SOLN
INTRAMUSCULAR | Status: DC | PRN
  Administered 2022-04-07 (×2): 25 ug via INTRAVENOUS

## 2022-04-07 MED ORDER — SPIRONOLACTONE 25 MG PO TABS: 25.0000 mg | ORAL_TABLET | Freq: Two times a day (BID) | ORAL | 5 refills | Status: AC

## 2022-04-07 MED ORDER — POTASSIUM CHLORIDE CRYS ER 20 MEQ PO TBCR
40.0000 meq | EXTENDED_RELEASE_TABLET | Freq: Two times a day (BID) | ORAL | Status: DC
  Filled 2022-04-07: qty 2

## 2022-04-07 MED ORDER — CHLORHEXIDINE GLUCONATE 4 % EX LIQD: 4.0000 "application " | Freq: Once | CUTANEOUS | Status: DC

## 2022-04-07 MED ORDER — ONDANSETRON HCL 4 MG/2ML IJ SOLN: 4.0000 mg | Freq: Four times a day (QID) | INTRAMUSCULAR | Status: DC | PRN

## 2022-04-07 MED ORDER — CEFAZOLIN SODIUM-DEXTROSE 2-4 GM/100ML-% IV SOLN
2.0000 g | INTRAVENOUS | Status: AC
  Administered 2022-04-07: 2 g via INTRAVENOUS

## 2022-04-07 MED ORDER — HEPARIN (PORCINE) IN NACL 1000-0.9 UT/500ML-% IV SOLN
INTRAVENOUS | Status: AC
  Filled 2022-04-07: qty 500

## 2022-04-07 MED ORDER — POTASSIUM CHLORIDE CRYS ER 20 MEQ PO TBCR
EXTENDED_RELEASE_TABLET | ORAL | Status: AC
  Filled 2022-04-07: qty 2

## 2022-04-07 MED ORDER — MAGNESIUM SULFATE 2 GM/50ML IV SOLN
2.0000 g | Freq: Once | INTRAVENOUS | Status: AC
  Administered 2022-04-07: 2 g via INTRAVENOUS
  Filled 2022-04-07: qty 50

## 2022-04-07 MED ORDER — ATROPINE SULFATE 1 MG/10ML IJ SOSY
PREFILLED_SYRINGE | INTRAMUSCULAR | Status: AC
  Filled 2022-04-07: qty 10

## 2022-04-07 MED ORDER — POTASSIUM CHLORIDE 10 MEQ/100ML IV SOLN
10.0000 meq | Freq: Once | INTRAVENOUS | Status: AC
  Administered 2022-04-07: 10 meq via INTRAVENOUS
  Filled 2022-04-07: qty 100

## 2022-04-07 MED ORDER — SODIUM CHLORIDE 0.9 % IV SOLN
80.0000 mg | INTRAVENOUS | Status: AC
  Administered 2022-04-07: 80 mg

## 2022-04-07 MED ORDER — FUROSEMIDE 10 MG/ML IJ SOLN
INTRAMUSCULAR | Status: DC | PRN
  Administered 2022-04-07: 80 mg via INTRAVENOUS

## 2022-04-07 MED ORDER — SODIUM CHLORIDE 0.9 % IV SOLN
INTRAVENOUS | Status: DC
  Administered 2022-04-07: 10 mL/h via INTRAVENOUS

## 2022-04-07 MED ORDER — POTASSIUM CHLORIDE 10 MEQ/100ML IV SOLN
10.0000 meq | INTRAVENOUS | Status: DC
  Filled 2022-04-07: qty 100

## 2022-04-07 MED ORDER — FENTANYL CITRATE (PF) 100 MCG/2ML IJ SOLN
INTRAMUSCULAR | Status: AC
  Filled 2022-04-07: qty 2

## 2022-04-07 MED ORDER — SODIUM CHLORIDE 0.9 % IV SOLN
INTRAVENOUS | Status: AC
  Filled 2022-04-07: qty 2

## 2022-04-07 MED ORDER — CEFAZOLIN SODIUM-DEXTROSE 2-4 GM/100ML-% IV SOLN
INTRAVENOUS | Status: AC
  Filled 2022-04-07: qty 100

## 2022-04-07 MED ORDER — MIDAZOLAM HCL 5 MG/5ML IJ SOLN
INTRAMUSCULAR | Status: DC | PRN
  Administered 2022-04-07: 2 mg via INTRAVENOUS
  Administered 2022-04-07: 1 mg via INTRAVENOUS

## 2022-04-07 MED ORDER — SODIUM CHLORIDE 0.9 % IV SOLN: INTRAVENOUS | Status: DC

## 2022-04-07 MED ORDER — POTASSIUM CHLORIDE 10 MEQ/100ML IV SOLN
10.0000 meq | INTRAVENOUS | Status: AC
  Administered 2022-04-07: 10 meq via INTRAVENOUS
  Filled 2022-04-07: qty 100

## 2022-04-07 MED ORDER — ACETAMINOPHEN 325 MG PO TABS
325.0000 mg | ORAL_TABLET | ORAL | Status: DC | PRN
  Filled 2022-04-07: qty 2

## 2022-04-07 MED ORDER — MIDAZOLAM HCL 5 MG/5ML IJ SOLN
INTRAMUSCULAR | Status: AC
  Filled 2022-04-07: qty 5

## 2022-04-07 MED ORDER — POTASSIUM CHLORIDE 10 MEQ/100ML IV SOLN
INTRAVENOUS | Status: AC
  Filled 2022-04-07: qty 100

## 2022-04-07 MED ORDER — HEPARIN (PORCINE) IN NACL 1000-0.9 UT/500ML-% IV SOLN
INTRAVENOUS | Status: DC | PRN
  Administered 2022-04-07: 500 mL

## 2022-04-07 MED ORDER — LIDOCAINE HCL 1 % IJ SOLN
INTRAMUSCULAR | Status: AC
  Filled 2022-04-07: qty 60

## 2022-04-07 MED ORDER — FUROSEMIDE 10 MG/ML IJ SOLN
INTRAMUSCULAR | Status: AC
  Filled 2022-04-07: qty 8

## 2022-04-07 SURGICAL SUPPLY — 8 items
CABLE SURGICAL S-101-97-12 (CABLE) ×2 IMPLANT
ICD MOMENTUM D120 (ICD Generator) ×1 IMPLANT
LEAD RELIANCE 0138-64 (Lead) ×1 IMPLANT
MAT PREVALON FULL STRYKER (MISCELLANEOUS) ×1 IMPLANT
PAD DEFIB RADIO PHYSIO CONN (PAD) ×2 IMPLANT
SHEATH 9.5FR PRELUDE SNAP 13 (SHEATH) ×1 IMPLANT
TRAY PACEMAKER INSERTION (PACKS) ×2 IMPLANT
WIRE AMPLATZ SS-J .035X180CM (WIRE) ×1 IMPLANT

## 2022-04-07 NOTE — Interval H&P Note (Signed)
History and Physical Interval Note: ? ?04/07/2022 ?11:37 AM ? ?Cillian Gwinner  has presented today for surgery, with the diagnosis of cardiomyopathy.  The various methods of treatment have been discussed with the patient and family. After consideration of risks, benefits and other options for treatment, the patient has consented to  Procedure(s): ?ICD IMPLANT (N/A) as a surgical intervention.  The patient's history has been reviewed, patient examined, no change in status, stable for surgery.  I have reviewed the patient's chart and labs.  Questions were answered to the patient's satisfaction.   ? ? ?Sherryl Manges ? ? ?

## 2022-04-07 NOTE — Discharge Instructions (Signed)
? ? ?  Supplemental Discharge Instructions for  ?Pacemaker/Defibrillator Patients ? ?Tomorrow, 04/08/22, send in a device transmission ? ?Activity ?No heavy lifting or vigorous activity with your left/right arm for 6 to 8 weeks.  Do not raise your left/right arm above your head for one week.  Gradually raise your affected arm as drawn below. ? ?        ?   04/12/22                    04/13/22                    04/14/22                   04/15/22 ?__ ? ?NO DRIVING for  1 week   ; you may begin driving on   6/83/41  . ? ?WOUND CARE ?Keep the wound area clean and dry.  Do not get this area wet , no showers for 24 hours; you may shower on   04/08/22 evening  . ?Tomorrow, 04/08/22, remove the arm sling ?Dr. Graciela Husbands used DERMABOND to close your wound.  DO NOT peel this off.  Do not rub/scrub the area, pat dry. ?No bandage is needed on the site.  DO  NOT apply any creams, oils, or ointments to the wound area. ?If you notice any drainage or discharge from the wound, any swelling or bruising at the site, or you develop a fever > 101? F after you are discharged home, call the office at once. ? ?Special Instructions ?You are still able to use cellular telephones; use the ear opposite the side where you have your pacemaker/defibrillator.  Avoid carrying your cellular phone near your device. ?When traveling through airports, show security personnel your identification card to avoid being screened in the metal detectors.  Ask the security personnel to use the hand wand. ?Avoid arc welding equipment, MRI testing (magnetic resonance imaging), TENS units (transcutaneous nerve stimulators).  Call the office for questions about other devices. ?Avoid electrical appliances that are in poor condition or are not properly grounded. ?Microwave ovens are safe to be near or to operate. ? ?Additional information for defibrillator patients should your device go off: ?If your device goes off ONCE and you feel fine afterward, notify the device clinic  nurses. ?If your device goes off ONCE and you do not feel well afterward, call 911. ?If your device goes off TWICE, call 911. ?If your device goes off THREE times in one day, call 911. ? ?DO NOT DRIVE YOURSELF OR A FAMILY MEMBER ?WITH A DEFIBRILLATOR TO THE HOSPITAL--CALL 911. ? ?

## 2022-04-07 NOTE — Progress Notes (Signed)
Clarified Potassium orders with Dr Graciela Husbands. Verbal Orders received for IV and PO potassium ?

## 2022-04-08 ENCOUNTER — Telehealth: Payer: Self-pay

## 2022-04-08 ENCOUNTER — Encounter (HOSPITAL_COMMUNITY): Payer: Self-pay | Admitting: Internal Medicine

## 2022-04-08 MED FILL — Atropine Sulfate Soln Prefill Syr 1 MG/10ML (0.1 MG/ML): INTRAMUSCULAR | Qty: 10 | Status: AC

## 2022-04-08 NOTE — Telephone Encounter (Signed)
Follow-up after same day discharge: ?Implant date: 04/07/22 ?MD: Sherryl Manges, MD ?Device: [employer] ICD ?Location: left chest ? ? ?Wound check visit: 04/17/22 at 2:00 ?90 day MD follow-up: 07/08/22 at 1:45 ? ?Remote Transmission received:yes ? ?Dressing removed: n/a ?Sling removed:  ? ?Unsuccessful telephone encounter to patient to follow up post ICD implant. Hipaa compliant VM message left requesting call back to [phone]. ? ?  ?

## 2022-04-08 NOTE — Telephone Encounter (Signed)
-----   Message from Sheilah Pigeon, [place] sent at 04/07/2022  2:33 PM EDT ----- ?Same day d/c ? ?BSci ICD ? ?SK ? ?

## 2022-04-09 ENCOUNTER — Telehealth: Payer: Self-pay

## 2022-04-09 DIAGNOSIS — E876 Hypokalemia: Secondary | ICD-10-CM

## 2022-04-09 NOTE — Telephone Encounter (Signed)
Spoke to patient and sling has been removed.  Patient has dermabond in place and aware not to remove. Advised to call if he has further questions or concerns.  ?

## 2022-04-09 NOTE — Telephone Encounter (Signed)
Called pt informed that lab order will be released and can have blood work drawn at any [facility].  Pt expresses has had labs drawn at [place] close to home previously.  Pt will have labs drawn 04/10/22. Lab appointment for 04/10/22 canceled.  Pt had no further questions or concerns.  ?

## 2022-04-09 NOTE — Telephone Encounter (Signed)
Patient called and would like to get his blood work location changed to [facility] instead of [address]. Advised I would forward to triage for assistance.  ?

## 2022-04-10 ENCOUNTER — Other Ambulatory Visit: Payer: 59

## 2022-04-11 LAB — BASIC METABOLIC PANEL
BUN/Creatinine Ratio: 11 (ref 9–20)
BUN: 9 mg/dL (ref 6–20)
CO2: 28 mmol/L (ref 20–29)
Calcium: 9 mg/dL (ref 8.7–10.2)
Chloride: 90 mmol/L — ABNORMAL LOW (ref 96–106)
Creatinine, Ser: 0.81 mg/dL (ref 0.76–1.27)
Glucose: 93 mg/dL (ref 70–99)
Potassium: 3 mmol/L — ABNORMAL LOW (ref 3.5–5.2)
Sodium: 140 mmol/L (ref 134–144)
eGFR: 122 mL/min/{1.73_m2} (ref >59)

## 2022-04-13 NOTE — Progress Notes (Signed)
?Advanced Heart Failure Clinic Note  ? ?PCP: Default, Provider, MD ?PCP-Cardiologist: Dr. Gala Romney ? ?HPI: ?Brandon Huber is a 31 yo with morbid obesity, severe HTN, tobacco abuse and systolic heart failure/NICM.  ? ?He presented to [facility] on 7/20 with acute HF. Echo EF < 25%, severely dilated LV 1-2+ Brandon. LHC: no sCAD. RHC PCWP 20, Fick CO/CI 4.02/1.7. cMRI no LGE but severely dilated LV and RV.  ? ?Seen in HF Clinic in 9/20. NYHA IIIb-IV and marked volume overload in the setting of medication non compliance. He was directed admitted to hospital from clinic. Echo EF <20%.  ? ?Echo 11/21 EF 25%  ? ?We saw him on 01/23/21 and was pale, diaphoretic and tachycardic concerning for low output. RHC arranged 01/25/21 and patient looked much better on arrival with HR in 80-90s. Hemodynamics well compensated.  ? ?Referred to [facility] in 10/22 after CPX for possible transplant but was out of network so couldn't make an appt. Seen at [facility] in 3/23 and transplant eval started ? ?Underwent ICD implant ([employer]) by Dr. Graciela Husbands on 04/07/22. Volume status up at the time and given IV lasix.  ? ?Today he returns for HF follow up. He has been seen at [facility] for transplant w/u. He will complete 3 day transplant work up 04/22/22 . Overall feeling fine. Mild SOB with exertion. Denies PND/Orthopnea. L upper chest sore from ICD implant.  Not as active as before due to back pain. Appetite ok. He has been eating fast food a little more. Eating pineapple snack cups. No fever or chills. Weight at home 245-250 pounds. Taking all medications. ? ? ?CPX 10/10/21 ?FVC 3.36 (67%)      ?FEV1 2.45 (59%)        ?FEV1/FVC 73 (87%)        ? ?MVV 111 (59%)      ? ?Resting HR: 107 Standing HR: 109 Peak HR: 158   (83% age predicted max HR)  ?BP rest: 102/60 Standing BP: 108/62 BP peak: 124/62  ?Peak VO2: 13.4 (38% predicted peak VO2)  ?VE/VCO2 slope:  48  ?Peak RER: 1.08  ?VE/MVV:  54%  ? ?Cardiac studies: ?CPX 04/22 submaximal effort, pVO2 15.0 slope 43 available data  suggests severe functional impairment d/t heart failure with severely elevated ZY/YQ82 slope, mildly blunted BP response, desaturations down to 77% with exercise ? ?RHC 01/25/21 ?RA = 9 ?RV = 32/12 ?PA =  32/14 (23) ?PCW = 16 ?Fick cardiac output/index = 9.6/4.1 ?Thermo CO/CI = 7.4/3.1 ?PVR = 1.0 WU ?Ao sat = 97% ?PA sat = 81%, 82% ?SVC sat = 78% ? ?Echo 09/28/19 EF 15%. Mild RV dysfunction ?Echo 01/04/20 EF 20-25% RV ok  ?Echo 11/21 EF 25%  ? ?cMRI 7/20 ([facility]): Markedly dilated left ventricle, dilated bilateral atria, global hypokinesis and reduced ejection fraction. Mild to moderate mitral valve regurgitation.  ? ?ZIO 10/21: SR average HR 111, rare PVC/PACs, no high-grade arrhythmias ? ?Current Outpatient Medications  ?Medication Sig Dispense Refill  ? carvedilol (COREG) 12.5 MG tablet Take 1 and 1/2 tablets (18.75 mg total) by mouth 2 (two) times daily with a meal. 270 tablet 2  ? dapagliflozin propanediol (FARXIGA) 10 MG TABS tablet TAKE 1 TABLET (10 MG) BY MOUTH DAILY BEFORE BREAKFAST. 90 tablet 3  ? digoxin (LANOXIN) 0.125 MG tablet Take 1 tablet (125 mcg total) by mouth daily. 90 tablet 2  ? ivabradine (CORLANOR) 5 MG TABS tablet Take 1 tablet (5 mg total) by mouth 2 (two) times daily with a meal.  60 tablet 6  ? magnesium oxide (MAG-OX) 400 MG tablet Take 1 tablet (400 mg total) by mouth 2 (two) times daily. 60 tablet 3  ? potassium chloride SA (KLOR-CON M) 20 MEQ tablet Take 80 mEq by mouth 3 (three) times daily.    ? sacubitril-valsartan (ENTRESTO) 97-103 MG Take 1 tablet by mouth 2 (two) times daily.    ? spironolactone (ALDACTONE) 25 MG tablet Take 1 tablet (25 mg total) by mouth 2 (two) times daily. 60 tablet 5  ? torsemide (DEMADEX) 20 MG tablet Take 3 tablets (60 mg total) by mouth daily. 30 tablet 11  ? ?No current facility-administered medications for this encounter.  ? ?No Known Allergies ? ?Social History  ? ?Socioeconomic History  ? Marital status: Single  ?  Spouse name: Not on file  ? Number of  children: Not on file  ? Years of education: Not on file  ? Highest education level: Not on file  ?Occupational History  ? Not on file  ?Tobacco Use  ? Smoking status: Never  ? Smokeless tobacco: Never  ?Substance and Sexual Activity  ? Alcohol use: Never  ? Drug use: Not on file  ? Sexual activity: Not on file  ?Other Topics Concern  ? Not on file  ?Social History Narrative  ? Not on file  ? ?Social Determinants of Health  ? ?Financial Resource Strain: Not on file  ?Food Insecurity: Not on file  ?Transportation Needs: Not on file  ?Physical Activity: Not on file  ?Stress: Not on file  ?Social Connections: Not on file  ?Intimate Partner Violence: Not on file  ? ?BP 100/60   Wt 114.1 kg (251 lb 9.6 oz)   SpO2 100%   BMI 34.12 kg/m?  ? ?Wt Readings from Last 3 Encounters:  ?04/14/22 114.1 kg (251 lb 9.6 oz)  ?04/07/22 110.2 kg (243 lb)  ?03/28/22 107.2 kg (236 lb 6.4 oz)  ? ?Body mass index is 34.12 kg/m?. ? ?PHYSICAL EXAM: ?General:  Obese male. No resp difficulty ?HEENT: normal ?Neck: supple. JVP 9-10. Carotids 2+ bilat; no bruits. No lymphadenopathy or thryomegaly appreciated. ?Cor: PMI nondisplaced. Regular rate & rhythm. No rubs, gallops or murmurs. ?Lungs: clear ?Abdomen: soft, nontender, nondistended. No hepatosplenomegaly. No bruits or masses. Good bowel sounds. ?Extremities: no cyanosis, clubbing, rash, tr edema + varicosities ?Neuro: alert & orientedx3, cranial nerves grossly intact. moves all 4 extremities w/o difficulty. Affect pleasant ? ? ?ECG: SR 86 bpm QRS 110 ms PR 190 ms  ? ?ASSESSMENT & PLAN: ? ?1. Chronic systolic HF:   ?- Diagnosed  06/2019 at [facility]. LVEF at 20% w/ biventricular failure. LHC 7/20 normal coronaries. RHC showed mean wedge of 20, FICK CO/CI 4.02/1.7. cMRI showed no signs of infiltration but severely dilated LV and RV.  ?- ? Etiology. ?OSA, hypertensive heart disease vs familial (father w/ CHF) ?- Echo 11/21 EF <20%, RV moderate to severely reduced ?- Zio monitor  10/21 showed SR average HR 111, no high-grade arrhythmias, rare PACs/PVs ?- RHC 01/25/21 well compensated hemodynamics (much better than expected) ?- CPX 4/22 submaximal effort RER 0.95 pVO2  15.0  VeVCO2 43 suggests severe functional impairment d/t heart failure ?- Echo 08/21/21: EF 15-20% RV mild HK. Severe Brandon/TR. ? Small laminated apical clot  ?- CPX 10/22 Peak VO2: 13.4 (38% predicted peak VO2) VE/VCO2 slope:  48 RER 1.08  ?- s/p BSci ICD 4/23  ?- Stable NYHA III. Volume status mild volume overload. Continue torsemide 60 mg daily. Will  start metolazone 2.5 with kcl 40 every Tuesday ?- Continue Entresto 97/103 mg bid. ?- Continue spironolactone 25 mg daily.  ?- Continue digoxin 0.125 mg daily. ?- Continue Farxiga 10 mg daily. ?- Continue carvedilol 18.75 mg bid. ?- Continue ivabradine 5 mg bid.  -  Blood type AB+. Body mass index is 34.12 kg/m?. ? - Seen at [facility] in 3/23 (out of network for [facility]). Recommended proceeding with transplant eval. Also sent for genetics eval ?  ?2. HTN: ?- BP remains soft. No room to titrate meds ? ?3. Obesity:  ?- Has lost 50 pounds.  ?- Body mass index is 34.12 kg/m?. ?- Had sleep study in 2/21 with Dr. Mayford Knife  which was non-diagnostic due to no sleep. Will need in-lab sleep study. He has not scheduled. ?- No change. ?  ?4. Tobacco Abuse:  ?- Remains quit. No change. ? ?Arvilla Meres, MD  ?4:29 PM ? ?

## 2022-04-14 ENCOUNTER — Ambulatory Visit (HOSPITAL_COMMUNITY)
Admit: 2022-04-14 | Discharge: 2022-04-14 | Disposition: A | Discharge status: Home / Self Care | Payer: 59 | Attending: Internal Medicine | Admitting: Internal Medicine

## 2022-04-14 ENCOUNTER — Encounter (HOSPITAL_COMMUNITY): Payer: Self-pay | Admitting: Internal Medicine

## 2022-04-14 VITALS — BP 100/60 | HR 86 | Wt 251.6 lb

## 2022-04-14 DIAGNOSIS — I110 Hypertensive heart disease with heart failure: Secondary | ICD-10-CM | POA: Diagnosis not present

## 2022-04-14 DIAGNOSIS — Z7984 Long term (current) use of oral hypoglycemic drugs: Secondary | ICD-10-CM | POA: Insufficient documentation

## 2022-04-14 DIAGNOSIS — Z95810 Presence of automatic (implantable) cardiac defibrillator: Secondary | ICD-10-CM | POA: Insufficient documentation

## 2022-04-14 DIAGNOSIS — I5023 Acute on chronic systolic (congestive) heart failure: Secondary | ICD-10-CM | POA: Insufficient documentation

## 2022-04-14 DIAGNOSIS — E669 Obesity, unspecified: Secondary | ICD-10-CM | POA: Diagnosis not present

## 2022-04-14 DIAGNOSIS — Z79899 Other long term (current) drug therapy: Secondary | ICD-10-CM | POA: Insufficient documentation

## 2022-04-14 DIAGNOSIS — Z6834 Body mass index (BMI) 34.0-34.9, adult: Secondary | ICD-10-CM | POA: Diagnosis not present

## 2022-04-14 DIAGNOSIS — Z720 Tobacco use: Secondary | ICD-10-CM | POA: Diagnosis not present

## 2022-04-14 DIAGNOSIS — I10 Essential (primary) hypertension: Secondary | ICD-10-CM

## 2022-04-14 DIAGNOSIS — Z87891 Personal history of nicotine dependence: Secondary | ICD-10-CM | POA: Diagnosis not present

## 2022-04-14 LAB — BASIC METABOLIC PANEL
Anion gap: 9 (ref 5–15)
BUN: 7 mg/dL (ref 6–20)
CO2: 32 mmol/L (ref 22–32)
Calcium: 8.8 mg/dL — ABNORMAL LOW (ref 8.9–10.3)
Chloride: 95 mmol/L — ABNORMAL LOW (ref 98–111)
Creatinine, Ser: 1.22 mg/dL (ref 0.61–1.24)
GFR, Estimated: >60 mL/min (ref >60)
Glucose, Bld: 92 mg/dL (ref 70–99)
Potassium: 3 mmol/L — ABNORMAL LOW (ref 3.5–5.1)
Sodium: 136 mmol/L (ref 135–145)

## 2022-04-14 MED ORDER — METOLAZONE 2.5 MG PO TABS: 2.5000 mg | ORAL_TABLET | ORAL | 3 refills | Status: AC

## 2022-04-14 NOTE — Patient Instructions (Signed)
Medication Changes: ? ?Start Metolazone 2.5 mg every Tuesday. ? ?Lab Work: ? ?Labs done today, your results will be available in MyChart, we will contact you for abnormal readings. ? ? ?Testing/Procedures: ? ?none ? ?Referrals: ? ?none ? ?Special Instructions // Education: ? ?none ? ?Follow-Up in: 6 weeks  ? ?At the Advanced [facility] Failure Clinic, you and your health needs are our priority. We have a designated team specialized in the treatment of Heart Failure. This Care Team includes your primary Heart Failure Specialized Cardiologist (physician), Advanced Practice Providers (APPs- Physician Assistants and Nurse Practitioners), and Pharmacist who all work together to provide you with the care you need, when you need it.  ? ?You may see any of the following providers on your designated Care Team at your next follow up: ? ?Dr Arvilla Meres ?Dr Marca Ancona ?Tonye Becket, NP ?Robbie Lis, PA ?Jessica Milford,NP ?Anna Genre, PA ?Karle Plumber, PharmD ? ? ?Please be sure to bring in all your medications bottles to every appointment.  ? ?Need to Contact us: ? ?If you have any questions or concerns before your next appointment please send [place] a message through [employer] or call our office at [phone].   ? ?TO LEAVE A MESSAGE FOR THE NURSE SELECT OPTION 2, PLEASE LEAVE A MESSAGE INCLUDING: ?YOUR NAME ?DATE OF BIRTH ?CALL BACK NUMBER ?REASON FOR CALL**this is important as we prioritize the call backs ? ?YOU WILL RECEIVE A CALL BACK THE SAME DAY AS LONG AS YOU CALL BEFORE 4:00 PM ? ? ?

## 2022-04-17 ENCOUNTER — Ambulatory Visit (INDEPENDENT_AMBULATORY_CARE_PROVIDER_SITE_OTHER): Payer: 59

## 2022-04-17 ENCOUNTER — Other Ambulatory Visit: Payer: 59

## 2022-04-17 DIAGNOSIS — I428 Other cardiomyopathies: Secondary | ICD-10-CM

## 2022-04-17 MED ORDER — CEPHALEXIN 500 MG PO CAPS: 500.0000 mg | ORAL_CAPSULE | Freq: Three times a day (TID) | ORAL | 0 refills | Status: AC

## 2022-04-17 NOTE — Patient Instructions (Signed)
? ?  After Your ICD ?(Implantable Cardiac Defibrillator) ? ? ? ?Monitor your defibrillator site for redness, swelling, and drainage. Call the device clinic at [phone] immediately if you experience these symptoms or fever/chills.  ? ?Your incision was closed with Dermabond:  You may shower 1 day after your defibrillator implant and wash your incision with soap and water. Avoid lotions, ointments, or perfumes over your incision until it is well-healed. ? ?Do not lift, push or pull greater than 10 pounds with the affected arm until 6 weeks after your procedure. There are no other restrictions in arm movement after your wound check appointment. May 22 ? ?Your ICD is MRI compatible. ? ?Your ICD is designed to protect you from life threatening heart rhythms. Because of this, you may receive a shock.  ? ?1 shock with no symptoms:  Call the office during business hours. ?1 shock with symptoms (chest pain, chest pressure, dizziness, lightheadedness, shortness of breath, overall feeling unwell):  Call 911. ?If you experience 2 or more shocks in 24 hours:  Call 911. ?If you receive a shock, you should not drive.  ?[place] DMV - no driving for 6 months if you receive appropriate therapy from your ICD.  ? ?ICD Alerts:  Some alerts are vibratory and others beep. These are NOT emergencies. Please call our office to let us know. If this occurs at night or on weekends, it can wait until the next business day. Send a remote transmission. ? ?If your device is capable of reading fluid status (for heart failure), you will be offered monthly monitoring to review this with you.  ? ?Remote monitoring is used to monitor your ICD from home. This monitoring is scheduled every 91 days by our office. It allows [place] to keep an eye on the functioning of your device to ensure it is working properly. You will routinely see your Electrophysiologist annually (more often if necessary).  ?

## 2022-04-18 LAB — CUP PACEART INCLINIC DEVICE CHECK
Brady Statistic RV Percent Paced: 1 % — CL
Date Time Interrogation Session: 20230420000000
HighPow Impedance: 53 Ohm
Implantable Lead Implant Date: 20230410
Implantable Lead Location: 753860
Implantable Lead Model: 138
Implantable Lead Serial Number: 303292
Implantable Pulse Generator Implant Date: 20230410
Lead Channel Impedance Value: 483 Ohm
Lead Channel Pacing Threshold Amplitude: 0.7 V
Lead Channel Pacing Threshold Pulse Width: 0.4 ms
Lead Channel Sensing Intrinsic Amplitude: 14.7 mV
Lead Channel Setting Pacing Amplitude: 3.5 V
Lead Channel Setting Pacing Pulse Width: 0.4 ms
Lead Channel Setting Sensing Sensitivity: 0.5 mV
Pulse Gen Serial Number: 216740

## 2022-04-18 NOTE — Progress Notes (Signed)
Wound check appointment. Dermabond removed. Small open area noted with scant amt of drainage. Dr. Graciela Husbands in to assess. Keflex 500 mg TID for 7 days prescribed. Patient unable to return for wound check next week as he has appointment at [facility] for transplant eval. He will request provider assess wound at that time.  Normal device function. Thresholds, sensing, and impedances consistent with implant measurements. Device programmed at 3.5V for extra safety margin until 3 month visit. Histogram distribution appropriate for patient and level of activity. No ventricular arrhythmias noted. Patient educated about wound care, arm mobility, lifting restrictions, shock plan. Patient enrolled in remote monitoring with next transmission scheduled 07/08/22. ROV in 3 months with implanting physician. ? ? ? ?

## 2022-04-25 ENCOUNTER — Telehealth (HOSPITAL_COMMUNITY): Payer: Self-pay

## 2022-04-25 NOTE — Telephone Encounter (Addendum)
Pt aware, agreeable, and verbalized understanding ? Heading to [place] next week for workup and RHC. He will get them to send lab result to office. ? ?----- Message from Jolaine Artist, MD sent at 04/25/2022  2:12 PM EDT ----- ?Is he taking his potassium? If so, increase potassium by 40 meq per day on top of what he is already taking. Repeat 1 week.  ?

## 2022-05-07 ENCOUNTER — Telehealth (HOSPITAL_COMMUNITY): Payer: Self-pay | Admitting: *Deleted

## 2022-05-07 NOTE — Telephone Encounter (Signed)
Patient left vm requesting a call from Dr.Bensimhon directly. Pt is admitted to [facility] in [facility] awaiting heart transplant. Pt said they are keeping him until he has a transplant.  ? ?Call back # [phone]  ? ?Routed to Dr.Bensimhon  ?

## 2022-05-12 ENCOUNTER — Encounter (HOSPITAL_COMMUNITY): Payer: 59 | Admitting: Internal Medicine

## 2022-05-12 ENCOUNTER — Ambulatory Visit (HOSPITAL_COMMUNITY): Payer: 59

## 2022-06-10 ENCOUNTER — Encounter (HOSPITAL_COMMUNITY): Payer: 59 | Admitting: Internal Medicine

## 2022-07-08 ENCOUNTER — Encounter: Payer: 59 | Admitting: Internal Medicine

## 2022-07-15 DIAGNOSIS — Z95810 Presence of automatic (implantable) cardiac defibrillator: Secondary | ICD-10-CM | POA: Insufficient documentation

## 2022-07-16 ENCOUNTER — Telehealth: Payer: Self-pay

## 2022-07-16 NOTE — Telephone Encounter (Signed)
Pt called to let us know that he had a heart transplant in May. I cancelled all upcoming appointments and took him out of Bsx and marked him inactive in [employer].

## 2022-07-17 ENCOUNTER — Encounter: Payer: 59 | Admitting: Internal Medicine

## 2022-07-17 DIAGNOSIS — I5023 Acute on chronic systolic (congestive) heart failure: Secondary | ICD-10-CM

## 2022-07-17 DIAGNOSIS — I428 Other cardiomyopathies: Secondary | ICD-10-CM

## 2022-07-17 DIAGNOSIS — Z95810 Presence of automatic (implantable) cardiac defibrillator: Secondary | ICD-10-CM

## 2022-08-26 ENCOUNTER — Other Ambulatory Visit (HOSPITAL_COMMUNITY): Payer: Self-pay

## 2022-08-26 ENCOUNTER — Telehealth (HOSPITAL_COMMUNITY): Payer: Self-pay | Admitting: Pharmacy Technician

## 2022-08-26 ENCOUNTER — Other Ambulatory Visit (HOSPITAL_COMMUNITY): Payer: Self-pay | Admitting: *Deleted

## 2022-08-26 ENCOUNTER — Telehealth (HOSPITAL_COMMUNITY): Payer: Self-pay

## 2022-08-26 MED ORDER — IVABRADINE HCL 5 MG PO TABS: 5.0000 mg | ORAL_TABLET | Freq: Two times a day (BID) | ORAL | 11 refills | Status: AC

## 2022-08-26 MED ORDER — ENTRESTO 97-103 MG PO TABS: 1.0000 | ORAL_TABLET | Freq: Two times a day (BID) | ORAL | 11 refills | Status: AC

## 2022-08-26 NOTE — Telephone Encounter (Signed)
Patient Advocate Encounter   Received notification from [employer] that prior authorization is required for Corlanor 5MG . PA submitted and APPROVED on 08/26/2022. Test claim returns copay of $0.  Key 08/28/2022 Effective: 08/26/2022 - 08/27/2023  08/29/2023, CPhT Rx Patient Advocate Phone: [phone]

## 2022-08-26 NOTE — Telephone Encounter (Signed)
Advanced Heart Failure Patient Advocate Encounter  Received notification from [employer] that it's time to renew the patient's assistance. Looks like AMGEN assistance for [facility] ends in October. Upon further review, the patient is now commercially insured. PA for both medications has been approved through insurance. Test claim, $0 (insurance allows 30 day billing). Called and left the patient a message. Looks like he gets RXs [facility]. Sent 30 day RX request to [place] (CMA) to send to [facility].   Will not seek renewal of assistance at this time.  Archer Asa, CPhT

## 2022-08-26 NOTE — Telephone Encounter (Addendum)
Patient Advocate Encounter   Received notification from [employer] that prior authorization is required for [facility]. PA submitted and APPROVED on 08/26/2022. Test claim returns copay of $0.  Key [id number]  Effective: 08/26/2022 - 08/27/2023  Burnell Blanks, CPhT Rx Patient Advocate Phone: [phone]

## 2023-03-08 IMAGING — DX DG CHEST 2V
2 series · 2 of 2 positions shown · non-contrast
Comparison: None.

CLINICAL DATA: Postop.

EXAM:
CHEST - 2 VIEW

[w chest lat]
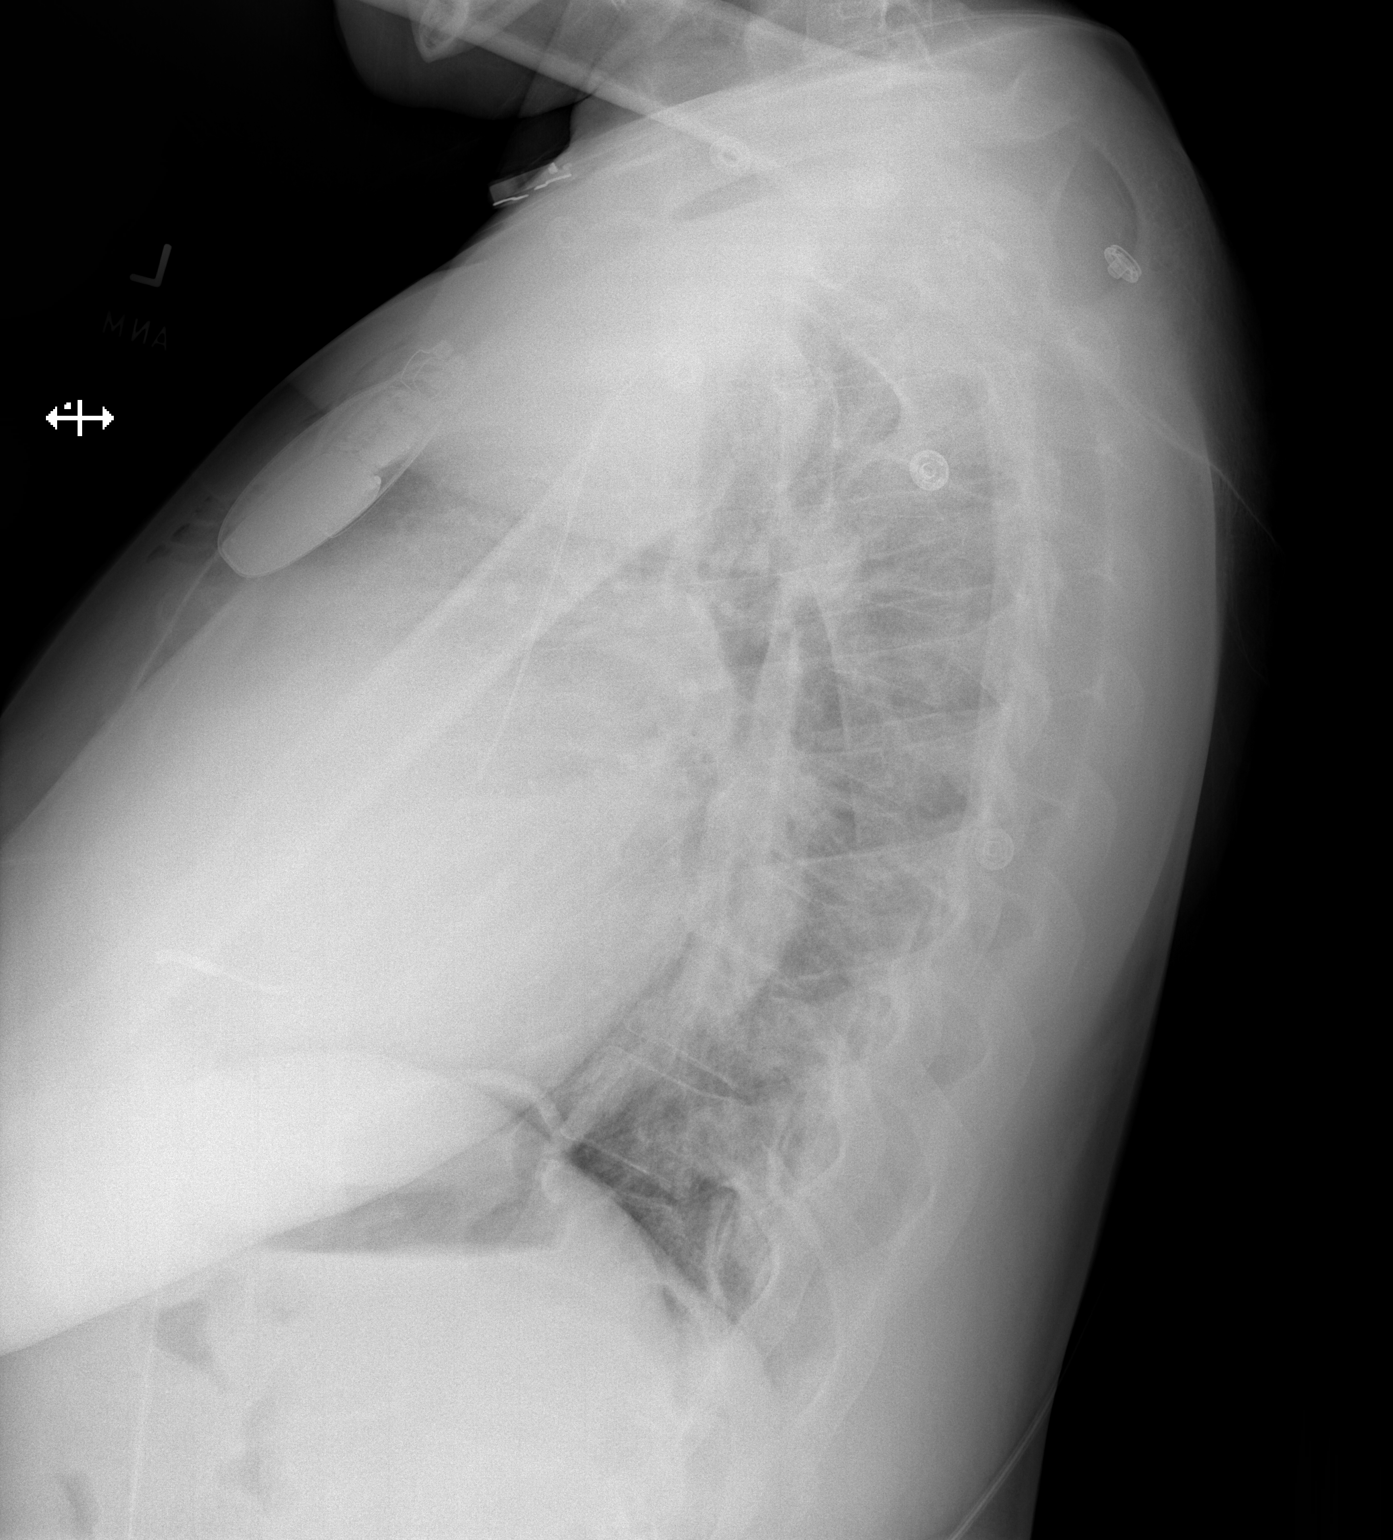

[w chest pa]
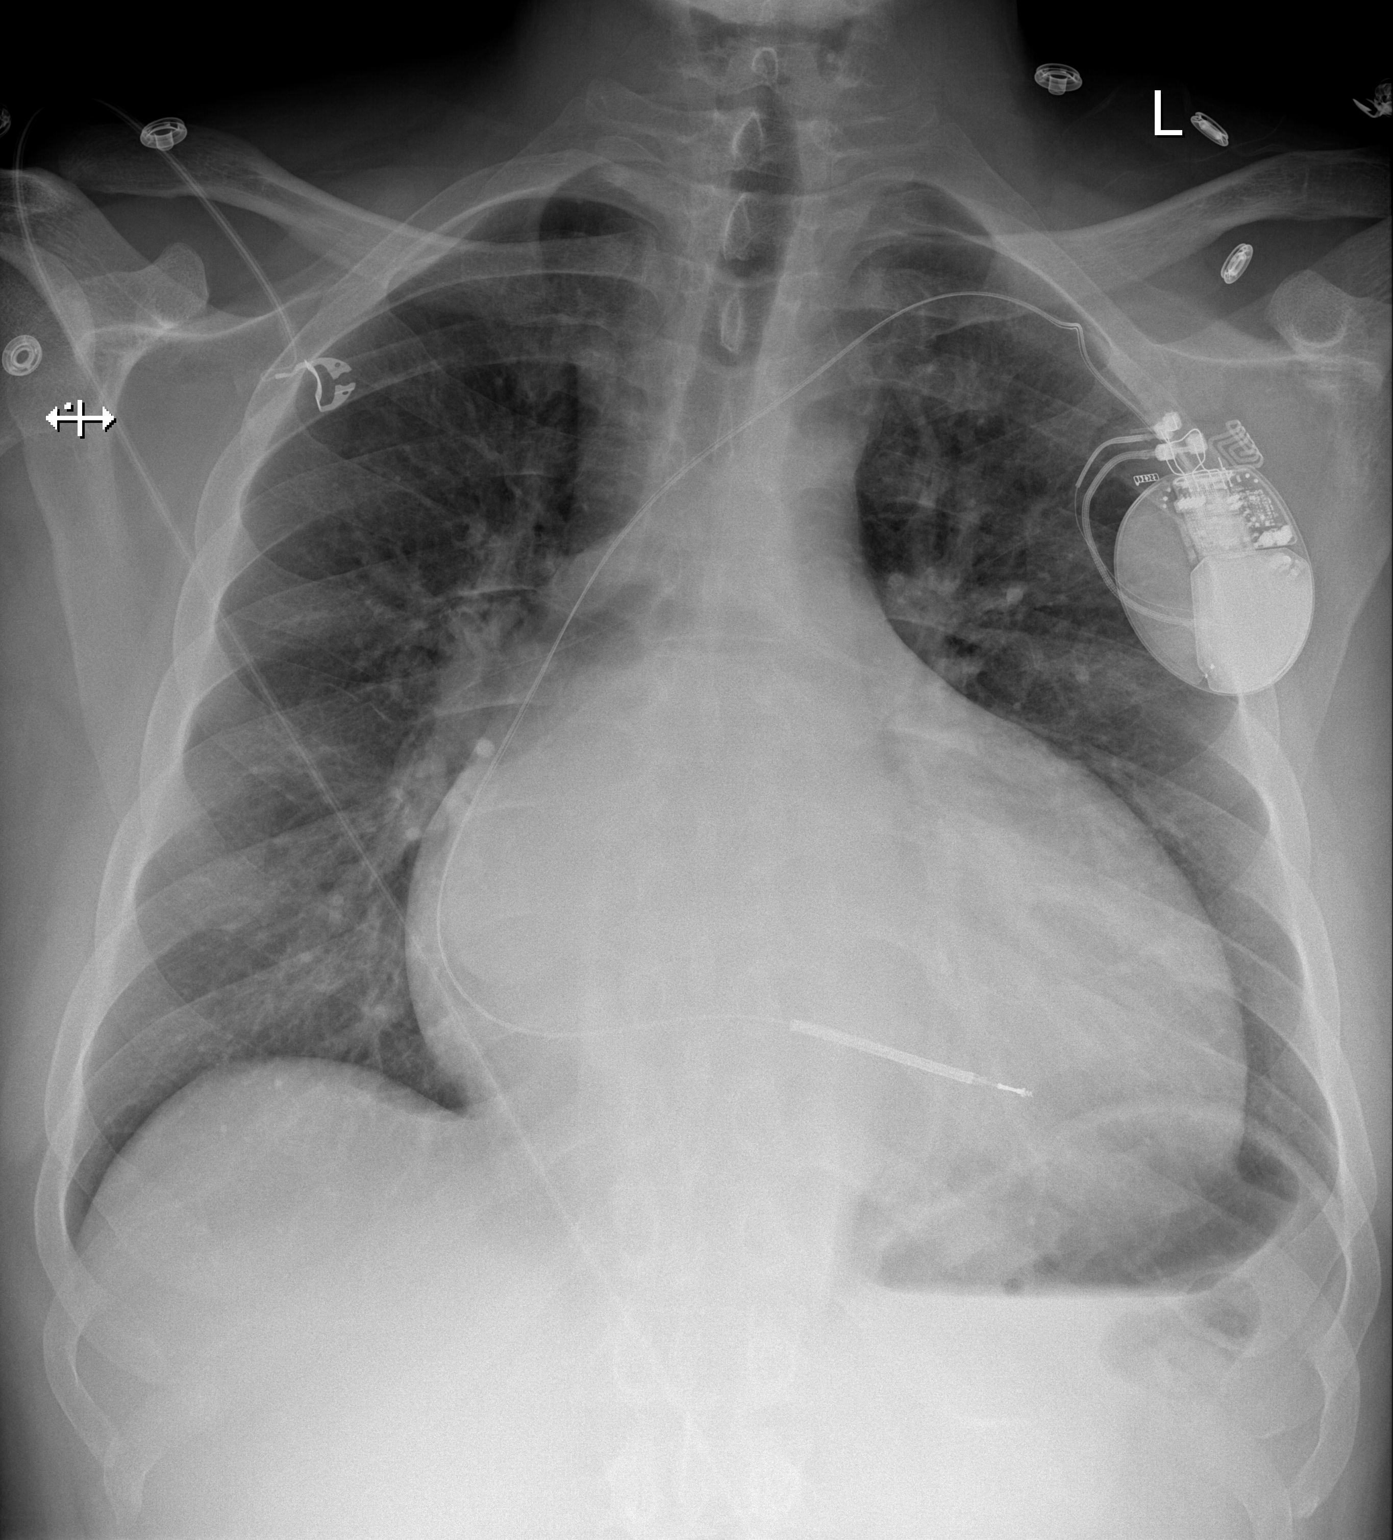

[2 of 2 positions shown; findings below may reference images not displayed]

FINDINGS: Single lead left-sided pacemaker with lead tip projecting over the
right ventricle. Slight undulation of the lead at the level of the
clavicle. The heart is enlarged with globular configuration. No
pneumothorax, pleural effusion, pulmonary edema or focal airspace
disease. Minor retrocardiac atelectasis.
IMPRESSION: 1. Single lead left-sided pacemaker with lead tip projecting over
the right ventricle. There is undulation of the lead at the level of
the clavicle, no discontinuity.
2. Cardiomegaly.
